# Patient Record
Sex: Male | Born: 1965 | Race: White | Hispanic: No | Marital: Married
Health system: Midwestern US, Community
[De-identification: ages and names within clinical notes are randomized; demographics above are authoritative.]

## PROBLEM LIST (undated history)

## (undated) DIAGNOSIS — I10 Essential (primary) hypertension: Secondary | ICD-10-CM

## (undated) DIAGNOSIS — K219 Gastro-esophageal reflux disease without esophagitis: Secondary | ICD-10-CM

## (undated) DIAGNOSIS — R202 Paresthesia of skin: Secondary | ICD-10-CM

## (undated) DIAGNOSIS — M549 Dorsalgia, unspecified: Secondary | ICD-10-CM

## (undated) DIAGNOSIS — Z87442 Personal history of urinary calculi: Secondary | ICD-10-CM

## (undated) DIAGNOSIS — R200 Anesthesia of skin: Secondary | ICD-10-CM

## (undated) HISTORY — DX: Dorsalgia, unspecified: M54.9

## (undated) HISTORY — DX: Essential (primary) hypertension: I10

## (undated) HISTORY — PX: COLONOSCOPY: SHX174

## (undated) HISTORY — PX: LITHOTRIPSY: SUR834

---

## 2001-08-26 ENCOUNTER — Encounter: Payer: Self-pay | Admitting: Emergency Medicine

## 2001-08-26 ENCOUNTER — Emergency Department (HOSPITAL_COMMUNITY): Admission: EM | Admit: 2001-08-26 | Discharge: 2001-08-26 | Payer: Self-pay | Admitting: Emergency Medicine

## 2004-03-23 HISTORY — PX: CYSTO: SHX6284

## 2004-08-02 ENCOUNTER — Encounter: Payer: Self-pay | Admitting: Emergency Medicine

## 2004-08-02 ENCOUNTER — Ambulatory Visit (HOSPITAL_COMMUNITY): Admission: RE | Admit: 2004-08-02 | Discharge: 2004-08-02 | Payer: Self-pay | Admitting: Urology

## 2004-08-07 ENCOUNTER — Ambulatory Visit (HOSPITAL_COMMUNITY): Admission: RE | Admit: 2004-08-07 | Discharge: 2004-08-07 | Payer: Self-pay | Admitting: Urology

## 2005-01-02 ENCOUNTER — Ambulatory Visit (HOSPITAL_COMMUNITY): Admission: AD | Admit: 2005-01-02 | Discharge: 2005-01-02 | Payer: Self-pay | Admitting: Urology

## 2005-01-02 ENCOUNTER — Emergency Department (HOSPITAL_COMMUNITY): Admission: EM | Admit: 2005-01-02 | Discharge: 2005-01-02 | Payer: Self-pay | Admitting: Emergency Medicine

## 2005-01-08 ENCOUNTER — Ambulatory Visit (HOSPITAL_COMMUNITY): Admission: RE | Admit: 2005-01-08 | Discharge: 2005-01-08 | Payer: Self-pay | Admitting: Urology

## 2005-01-20 ENCOUNTER — Emergency Department (HOSPITAL_COMMUNITY): Admission: EM | Admit: 2005-01-20 | Discharge: 2005-01-21 | Payer: Self-pay | Admitting: Emergency Medicine

## 2005-01-21 ENCOUNTER — Ambulatory Visit (HOSPITAL_COMMUNITY): Admission: RE | Admit: 2005-01-21 | Discharge: 2005-01-21 | Payer: Self-pay | Admitting: Urology

## 2005-01-21 ENCOUNTER — Ambulatory Visit (HOSPITAL_BASED_OUTPATIENT_CLINIC_OR_DEPARTMENT_OTHER): Admission: RE | Admit: 2005-01-21 | Discharge: 2005-01-21 | Payer: Self-pay | Admitting: Urology

## 2006-11-28 IMAGING — CT CT ABDOMEN W/O CM
1 series · 15 of 32 positions shown, 19 images · IV contrast (agent unspecified)
Comparison: 08/02/04.

CLINICAL DATA: Low back pain, testicular pain.  History of renal stones.   
ABDOMEN CT WITHOUT CONTRAST:
TECHNIQUE: Multidetector CT imaging of the abdomen was performed following the standard protocol without IV contrast.
TECHNIQUE: Multidetector CT imaging of the pelvis was performed following the standard protocol without IV contrast. 
No stone in the bladder or on either distal ureter. The prostate gland and seminal vesicles appear normal.  No mass or adenopathy.  No bowel pathology seen.

[Series 2: renal stone · axial · 0.70mm/px · z∈[-482,-147]mm · 15 of 75 slices shown, 19 images]
[im 5/75  soft-tissue]
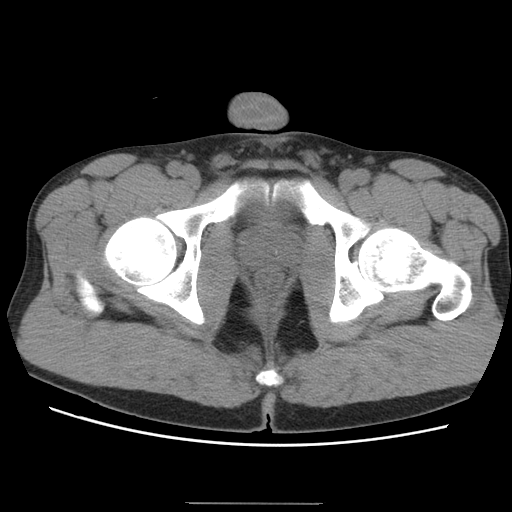
[im 5/75  bone]
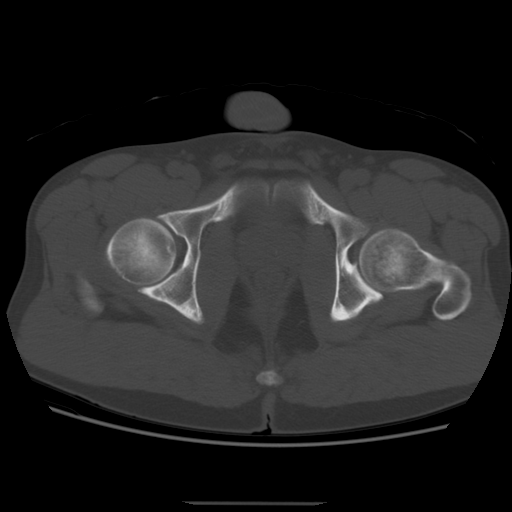
[im 10/75  soft-tissue]
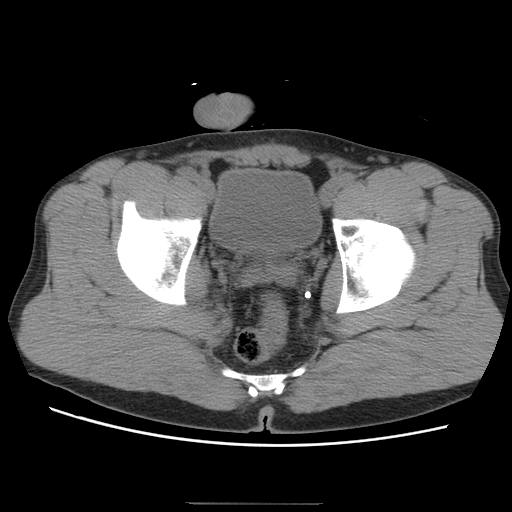
[im 15/75  soft-tissue]
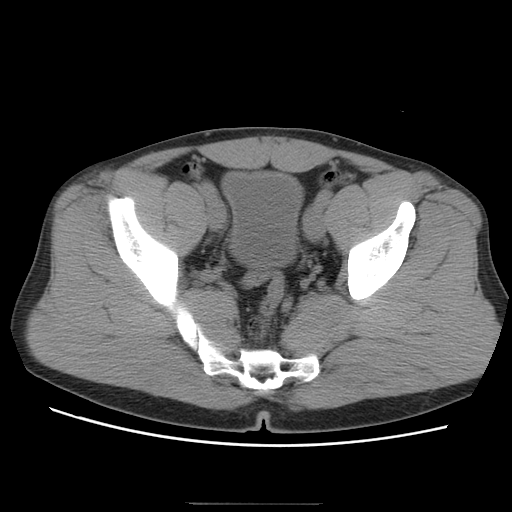
[im 22/75  soft-tissue]
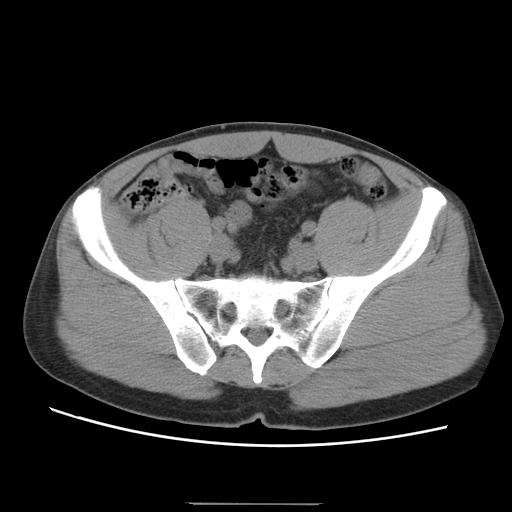
[im 27/75  soft-tissue]
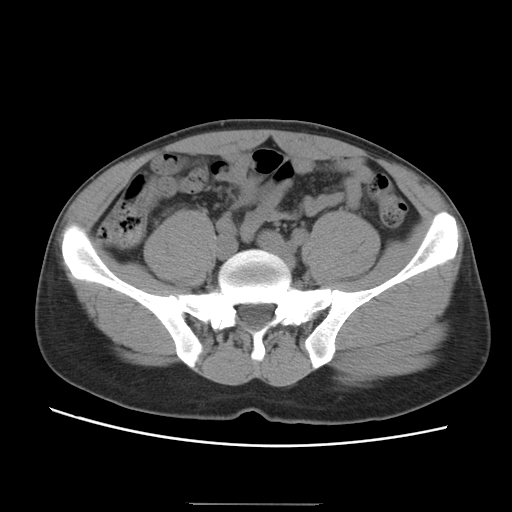
[im 32/75  soft-tissue]
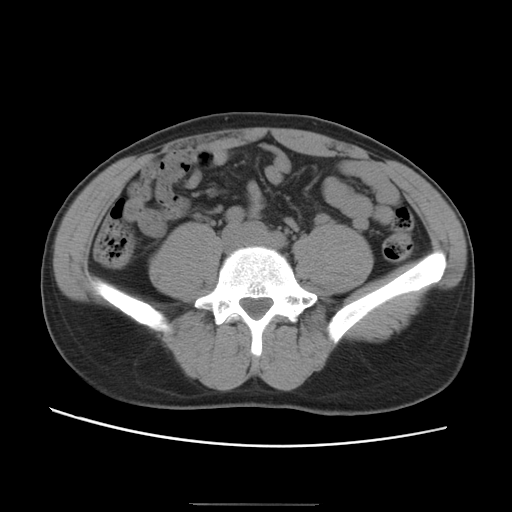
[im 39/75  soft-tissue]
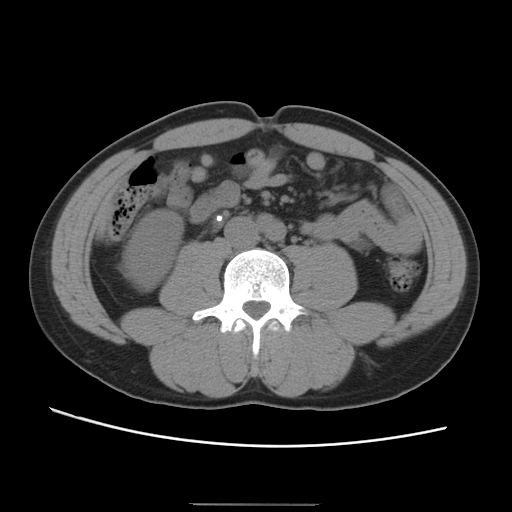
[im 43/75  soft-tissue]
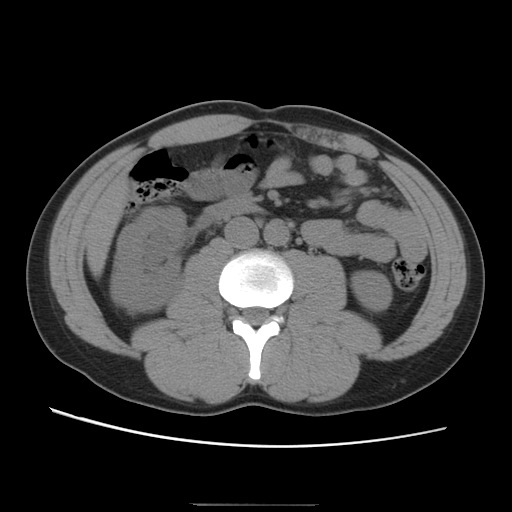
[im 48/75  soft-tissue]
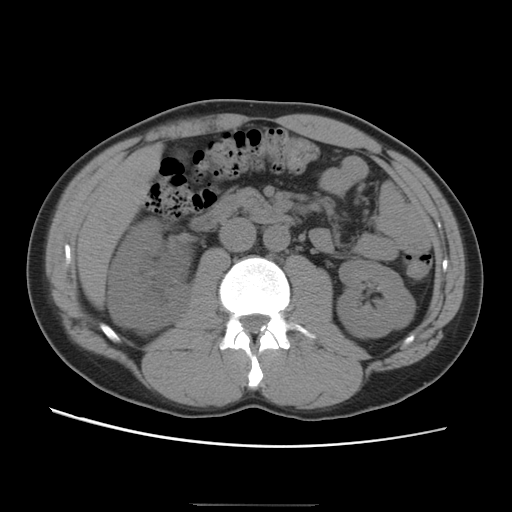
[im 48/75  bone]
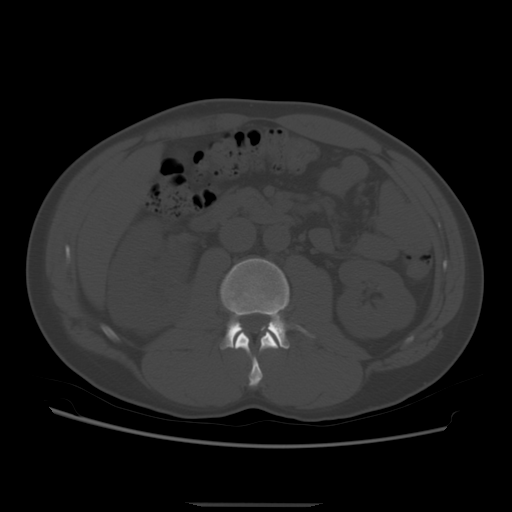
[im 53/75  soft-tissue]
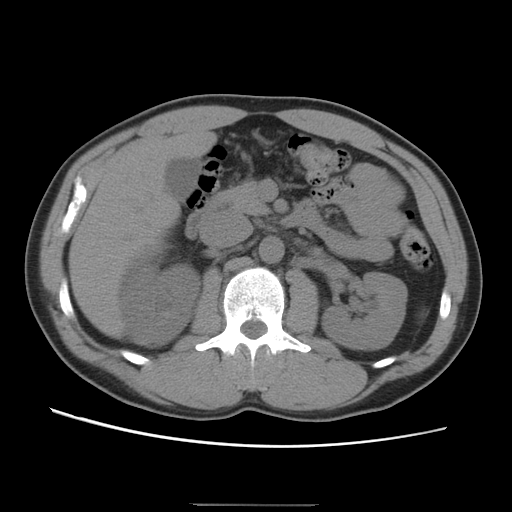
[im 60/75  soft-tissue]
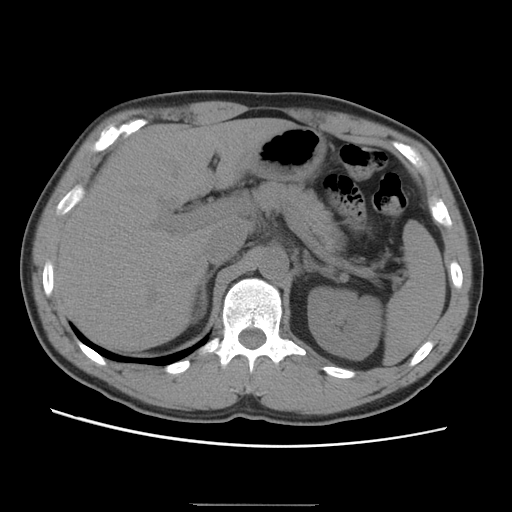
[im 65/75  soft-tissue]
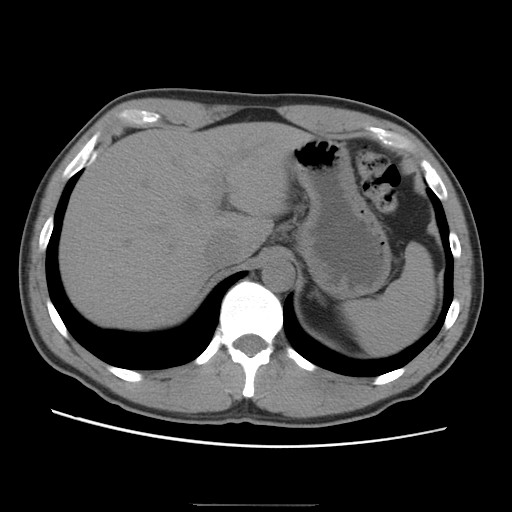
[im 65/75  lung]
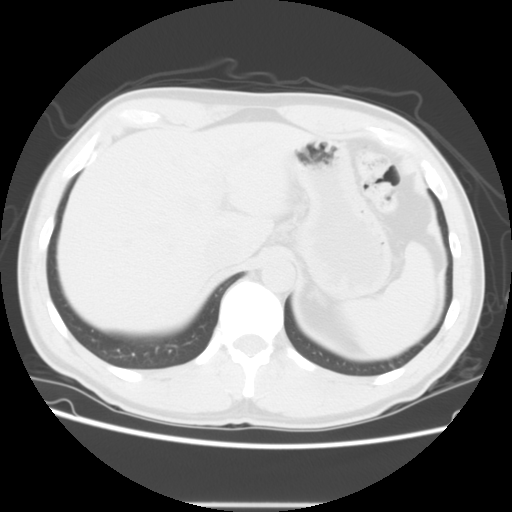
[im 67/75  lung]
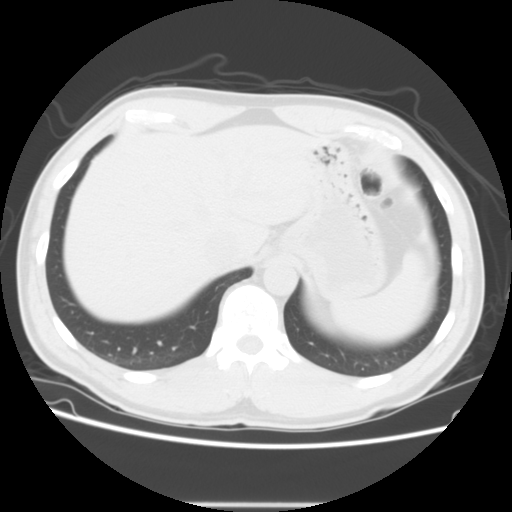
[im 70/75  soft-tissue]
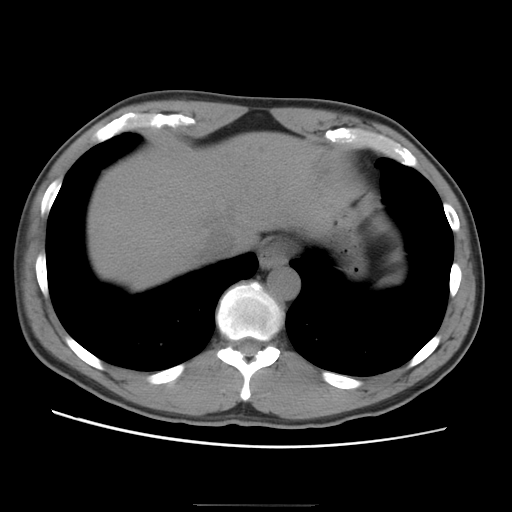
[im 70/75  lung]
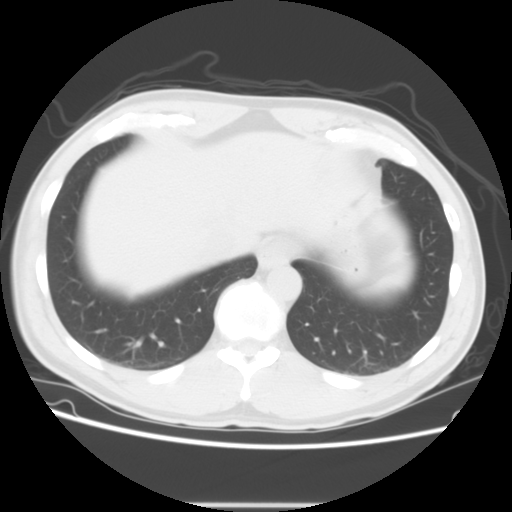
[im 72/75  lung]
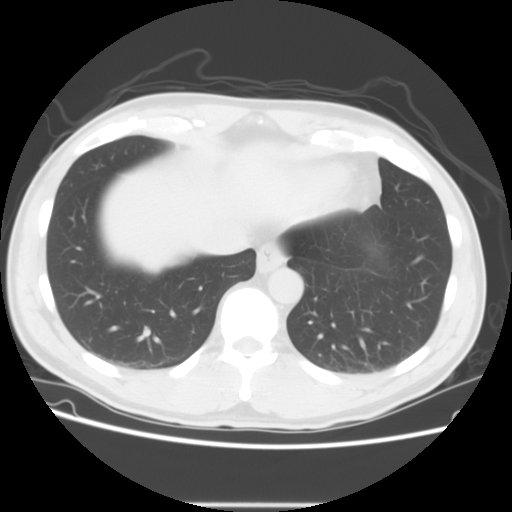

[15 of 32 positions shown; findings below may reference images not displayed]

FINDINGS: Lung bases are clear.  The liver, pancreas, gallbladder, spleen and adrenal glands are all normal.  The left kidney contains some tiny stones but there is no hydronephrosis.  
On the right, there is marked hydroureteronephrosis.  There are some tiny calcifications in the kidney.   There is a 5 mm stone in the proximal ureter at about the level of the mid to lower pole.  Below that, the ureter is not dilated.   The aorta and IVC are normal.  No free intraperitoneal fluid or air.
IMPRESSION: High-grade obstruction of the right kidney due to a 5 mm stone in the proximal ureter.  This is the same location that stone was located [REDACTED]. 
PELVIS CT WITHOUT CONTRAST:
IMPRESSION: Negative CT scan of the pelvis.

## 2008-03-23 LAB — HM COLONOSCOPY: HM Colonoscopy: NORMAL

## 2009-06-23 ENCOUNTER — Ambulatory Visit: Payer: Self-pay

## 2011-03-06 ENCOUNTER — Encounter (HOSPITAL_BASED_OUTPATIENT_CLINIC_OR_DEPARTMENT_OTHER): Payer: Self-pay

## 2011-03-13 ENCOUNTER — Ambulatory Visit (HOSPITAL_BASED_OUTPATIENT_CLINIC_OR_DEPARTMENT_OTHER): Payer: BC Managed Care – PPO | Attending: Allergy and Immunology

## 2011-10-26 ENCOUNTER — Ambulatory Visit: Payer: BC Managed Care – PPO | Admitting: Internal Medicine

## 2011-11-16 ENCOUNTER — Ambulatory Visit: Payer: BC Managed Care – PPO | Admitting: Internal Medicine

## 2011-11-16 DIAGNOSIS — Z0289 Encounter for other administrative examinations: Secondary | ICD-10-CM

## 2012-05-05 ENCOUNTER — Emergency Department: Payer: Self-pay | Admitting: Emergency Medicine

## 2013-04-27 ENCOUNTER — Other Ambulatory Visit: Payer: Self-pay | Admitting: Chiropractic Medicine

## 2013-04-27 DIAGNOSIS — S76319A Strain of muscle, fascia and tendon of the posterior muscle group at thigh level, unspecified thigh, initial encounter: Secondary | ICD-10-CM

## 2013-05-02 ENCOUNTER — Ambulatory Visit
Admission: RE | Admit: 2013-05-02 | Discharge: 2013-05-02 | Disposition: A | Discharge status: Home / Self Care | Payer: BC Managed Care – PPO | Source: Ambulatory Visit | Attending: Chiropractic Medicine | Admitting: Chiropractic Medicine

## 2013-05-02 DIAGNOSIS — S76319A Strain of muscle, fascia and tendon of the posterior muscle group at thigh level, unspecified thigh, initial encounter: Secondary | ICD-10-CM

## 2014-10-04 ENCOUNTER — Telehealth: Payer: Self-pay | Admitting: Family Medicine

## 2014-10-04 NOTE — Telephone Encounter (Signed)
This is not the dose in Allscript. We have 100mg  4 times daily. Please review and have someone refill. [facility]

## 2014-10-04 NOTE — Telephone Encounter (Signed)
Pt needs a refill on gabapentin 800 mg 3 times daily sent to [facility] phone [phone].  His dosage went up so he has taken up his prescription.  He has enough for tomorrow but will be out for the weekend.

## 2014-10-04 NOTE — Telephone Encounter (Signed)
This is not the dose in Allscript. We have 100mg  4 times daily. Please review and have someone refill. [facility]

## 2014-10-04 NOTE — Telephone Encounter (Signed)
Was sent on accident. I am very sorry. [facility]

## 2014-10-04 NOTE — Telephone Encounter (Signed)
He is not a [facility] patient so I cannot refill or address

## 2014-10-05 ENCOUNTER — Telehealth: Payer: Self-pay | Admitting: Family Medicine

## 2014-10-05 ENCOUNTER — Other Ambulatory Visit: Payer: Self-pay | Admitting: Family Medicine

## 2014-10-05 ENCOUNTER — Other Ambulatory Visit: Payer: Self-pay

## 2014-10-05 MED ORDER — GABAPENTIN 400 MG PO CAPS: 400.0000 mg | ORAL_CAPSULE | Freq: Three times a day (TID) | ORAL | Status: DC

## 2014-10-05 NOTE — Telephone Encounter (Signed)
Medication has been sent to Pharmacy.-jh

## 2014-10-05 NOTE — Telephone Encounter (Signed)
Mario Reef, Do I need to do anything on this patient or not?  His Gabapentin dose does not match what was requested.  Let me know when you find out.-jh

## 2014-10-05 NOTE — Telephone Encounter (Signed)
Pt called wanting to know if his  Gabapentin was  Refill pt call back # is  [phone]

## 2014-10-09 NOTE — Telephone Encounter (Signed)
No this was an error. [facility]

## 2014-10-09 NOTE — Telephone Encounter (Signed)
I have already sent his Gabapentin refill in to pharmacy.-jh

## 2014-10-24 ENCOUNTER — Ambulatory Visit (INDEPENDENT_AMBULATORY_CARE_PROVIDER_SITE_OTHER): Payer: 59 | Admitting: Family Medicine

## 2014-10-24 ENCOUNTER — Encounter: Payer: Self-pay | Admitting: Family Medicine

## 2014-10-24 VITALS — BP 126/84 | HR 76 | Temp 98.0°F | Resp 16 | Ht 69.0 in | Wt 193.0 lb

## 2014-10-24 DIAGNOSIS — M545 Low back pain, unspecified: Secondary | ICD-10-CM | POA: Insufficient documentation

## 2014-10-24 DIAGNOSIS — G8929 Other chronic pain: Secondary | ICD-10-CM | POA: Insufficient documentation

## 2014-10-24 DIAGNOSIS — E785 Hyperlipidemia, unspecified: Secondary | ICD-10-CM | POA: Insufficient documentation

## 2014-10-24 DIAGNOSIS — F419 Anxiety disorder, unspecified: Secondary | ICD-10-CM

## 2014-10-24 DIAGNOSIS — J028 Acute pharyngitis due to other specified organisms: Secondary | ICD-10-CM

## 2014-10-24 DIAGNOSIS — J0111 Acute recurrent frontal sinusitis: Secondary | ICD-10-CM | POA: Insufficient documentation

## 2014-10-24 DIAGNOSIS — K2970 Gastritis, unspecified, without bleeding: Secondary | ICD-10-CM | POA: Insufficient documentation

## 2014-10-24 DIAGNOSIS — J40 Bronchitis, not specified as acute or chronic: Secondary | ICD-10-CM | POA: Insufficient documentation

## 2014-10-24 DIAGNOSIS — B9789 Other viral agents as the cause of diseases classified elsewhere: Secondary | ICD-10-CM | POA: Insufficient documentation

## 2014-10-24 DIAGNOSIS — F32A Depression, unspecified: Secondary | ICD-10-CM | POA: Insufficient documentation

## 2014-10-24 DIAGNOSIS — G4700 Insomnia, unspecified: Secondary | ICD-10-CM | POA: Insufficient documentation

## 2014-10-24 DIAGNOSIS — F329 Major depressive disorder, single episode, unspecified: Secondary | ICD-10-CM | POA: Insufficient documentation

## 2014-10-24 DIAGNOSIS — I10 Essential (primary) hypertension: Secondary | ICD-10-CM | POA: Insufficient documentation

## 2014-10-24 DIAGNOSIS — R5383 Other fatigue: Secondary | ICD-10-CM | POA: Insufficient documentation

## 2014-10-24 DIAGNOSIS — R1013 Epigastric pain: Secondary | ICD-10-CM | POA: Insufficient documentation

## 2014-10-24 MED ORDER — FLUTICASONE PROPIONATE 50 MCG/ACT NA SUSP: 2.0000 | Freq: Every day | NASAL | Status: DC

## 2014-10-24 MED ORDER — AMOXICILLIN-POT CLAVULANATE 875-125 MG PO TABS: 1.0000 | ORAL_TABLET | Freq: Two times a day (BID) | ORAL | Status: DC

## 2014-10-24 MED ORDER — CHLORPHEN-PE-ACETAMINOPHEN 2-5-325 MG PO TABS: 1.0000 | ORAL_TABLET | ORAL | Status: DC

## 2014-10-24 NOTE — Assessment & Plan Note (Signed)
Supportive care. Likely no secondary bacterial infection. Paper prescription given for patient to fill with persistent symptoms when he is out of town. Alarm symptoms reviewed.  Pt cautioned to use decongestants sparingly due to HBP.

## 2014-10-24 NOTE — Progress Notes (Signed)
Subjective:    Patient ID: Mario Morgan, male    DOB: 1965-06-11, 49 y.o.   MRN: [id number]  HPI: Mario Morgan is a 49 y.o. male presenting on 10/24/2014 for Sinusitis   Sinusitis This is a recurrent problem. The current episode started in the past 7 days. The problem has been gradually worsening since onset. There has been no fever. His pain is at a severity of 2/10. Associated symptoms include headaches (slight), sinus pressure and sneezing. Pertinent negatives include no chills, congestion, coughing, ear pain, shortness of breath or sore throat. Past treatments include acetaminophen. The treatment provided mild relief.  Symptoms started on Sunday- sinus pain and pressure behind the eyes. Took mucinex without any relief. Has been using flonase for the last few days. Pt is concerned because he is going on out town on Friday and is worried he could have a sinus infection.       Past Medical History  Diagnosis Date  . Hypertension   . Depression   . Back pain   . Hyperlipidemia     No current outpatient prescriptions on file prior to visit.   No current facility-administered medications on file prior to visit.    Review of Systems  Constitutional: Negative for fever and chills.  HENT: Positive for rhinorrhea, sinus pressure and sneezing. Negative for congestion, ear pain, postnasal drip and sore throat.   Respiratory: Negative for cough, chest tightness, shortness of breath and wheezing.   Cardiovascular: Negative for chest pain, palpitations and leg swelling.  Skin: Negative.   Allergic/Immunologic: Positive for environmental allergies.  Neurological: Positive for headaches (slight).   Per HPI unless specifically indicated above     Objective:    BP 126/84 mmHg  Pulse 76  Temp(Src) 98 F (36.7 C) (Oral)  Resp 16  Ht 5\' 9"  (1.753 m)  Wt 193 lb (87.544 kg)  BMI 28.49 kg/m2  Wt Readings from Last 3 Encounters:  10/24/14 193 lb (87.544 kg)    Physical Exam   Constitutional: He appears well-developed and well-nourished. No distress.  HENT:  Head: Normocephalic.  Right Ear: Hearing and tympanic membrane normal. Tympanic membrane is not erythematous and not bulging.  Left Ear: Hearing and tympanic membrane normal. Tympanic membrane is not erythematous and not bulging.  Nose: Mucosal edema and rhinorrhea present. No sinus tenderness or nasal septal hematoma. Right sinus exhibits frontal sinus tenderness. Right sinus exhibits no maxillary sinus tenderness. Left sinus exhibits frontal sinus tenderness. Left sinus exhibits no maxillary sinus tenderness.  Mouth/Throat: Uvula is midline and mucous membranes are normal. No uvula swelling. No posterior oropharyngeal edema or posterior oropharyngeal erythema.  Neck: Neck supple. No Brudzinski's sign and no Kernig's sign noted.  Cardiovascular: Normal rate, regular rhythm and normal heart sounds.   Pulmonary/Chest: Breath sounds normal. No accessory muscle usage. No tachypnea. No respiratory distress.  Lymphadenopathy:    He has no cervical adenopathy.   Results for orders placed or performed in visit on 10/24/14  HM COLONOSCOPY  Result Value Ref Range   HM Colonoscopy normal       Assessment & Plan:   Problem List Items Addressed This Visit      Respiratory   Sinus infection - Primary    Supportive care. Likely no secondary bacterial infection. Paper prescription given for patient to fill with persistent symptoms when he is out of town. Alarm symptoms reviewed.  Pt cautioned to use decongestants sparingly due to HBP.       Relevant Medications  amoxicillin-clavulanate (AUGMENTIN) 875-125 MG per tablet   Chlorphen-Phenyleph-APAP (TYLENOL SINUS CONGESTION/PAIN) 2-5-325 MG TABS   fluticasone (FLONASE) 50 MCG/ACT nasal spray      Meds ordered this encounter  Medications  . cyclobenzaprine (FLEXERIL) 10 MG tablet    Sig: Take by mouth.  Marland Kitchen amoxicillin-clavulanate (AUGMENTIN) 875-125 MG per tablet     Sig: Take 1 tablet by mouth 2 (two) times daily.    Dispense:  14 tablet    Refill:  0    Order Specific Question:  Supervising Provider    Answer:  Arlis Porta [phone]  . Chlorphen-Phenyleph-APAP (TYLENOL SINUS CONGESTION/PAIN) 2-5-325 MG TABS    Sig: Take 1 tablet by mouth 4 (four) times a week.    Dispense:  80 each    Refill:  0    Order Specific Question:  Supervising Provider    Answer:  Arlis Porta [phone]  . fluticasone (FLONASE) 50 MCG/ACT nasal spray    Sig: Place 2 sprays into both nostrils daily.    Dispense:  16 g    Refill:  11    Order Specific Question:  Supervising Provider    Answer:  Arlis Porta [phone]      Follow up plan: Return if symptoms worsen or fail to improve.

## 2014-10-24 NOTE — Patient Instructions (Signed)
Your symptoms are consistent with a viral upper respiratory infection. At this time there is no need for antibiotics.  If your symptoms persist for > 10 days or get better and than worsen please let me know. You may have a secondary bacterial infection.  For your congestion try: Tylenol Sinus OTC. This may raise your blood pressure as it has a decongestant. Recommend using your neti pot 3 times daily to rinse your sinuses.   I think the root cause of your symptoms is allergies- I recommend taking Zyrtec or Claritin daily and using your Flonase.

## 2014-10-31 ENCOUNTER — Ambulatory Visit (INDEPENDENT_AMBULATORY_CARE_PROVIDER_SITE_OTHER): Payer: 59 | Admitting: Family Medicine

## 2014-10-31 ENCOUNTER — Encounter: Payer: Self-pay | Admitting: Family Medicine

## 2014-10-31 VITALS — BP 130/90 | HR 74 | Temp 97.5°F | Resp 16 | Ht 69.0 in | Wt 194.0 lb

## 2014-10-31 DIAGNOSIS — J0111 Acute recurrent frontal sinusitis: Secondary | ICD-10-CM | POA: Diagnosis not present

## 2014-10-31 DIAGNOSIS — I10 Essential (primary) hypertension: Secondary | ICD-10-CM

## 2014-10-31 LAB — CBC WITH DIFFERENTIAL/PLATELET
BASOS: 1 %
Basophils Absolute: 0 10*3/uL (ref 0.0–0.2)
EOS (ABSOLUTE): 0.2 10*3/uL (ref 0.0–0.4)
Eos: 3 %
HEMOGLOBIN: 15.7 g/dL (ref 12.6–17.7)
Hematocrit: 44.6 % (ref 37.5–51.0)
Immature Grans (Abs): 0 10*3/uL (ref 0.0–0.1)
Immature Granulocytes: 0 %
LYMPHS: 25 %
Lymphocytes Absolute: 1.6 10*3/uL (ref 0.7–3.1)
MCH: 29.5 pg (ref 26.6–33.0)
MCHC: 35.2 g/dL (ref 31.5–35.7)
MCV: 84 fL (ref 79–97)
MONOS ABS: 0.5 10*3/uL (ref 0.1–0.9)
Monocytes: 8 %
Neutrophils Absolute: 3.9 10*3/uL (ref 1.4–7.0)
Neutrophils: 63 %
Platelets: 247 10*3/uL (ref 150–379)
RBC: 5.32 x10E6/uL (ref 4.14–5.80)
RDW: 14.1 % (ref 12.3–15.4)
WBC: 6.2 10*3/uL (ref 3.4–10.8)

## 2014-10-31 MED ORDER — LEVOFLOXACIN 500 MG PO TABS: 500.0000 mg | ORAL_TABLET | Freq: Every day | ORAL | Status: DC

## 2014-10-31 MED ORDER — LEVOFLOXACIN 750 MG PO TABS: 750.0000 mg | ORAL_TABLET | Freq: Every day | ORAL | Status: DC

## 2014-10-31 NOTE — Assessment & Plan Note (Addendum)
Failed augmentin. Treat with second line levofloxacin once daily for 10 days. Supportive care at home. If symptoms not improving, consider looking for other causes or ENT evaluation.  CBC due to patient malaise.

## 2014-10-31 NOTE — Assessment & Plan Note (Addendum)
Continue lisinopril 10mg . Pt cautioned against use of sudafed or phenylephrine due to possibility of increasing blood pressure.  Pt due for CMP.

## 2014-10-31 NOTE — Patient Instructions (Addendum)

## 2014-10-31 NOTE — Progress Notes (Signed)
Subjective:    Patient ID: Mario Morgan, male    DOB: 16-Feb-1966, 49 y.o.   MRN: [id number]  HPI: Mario Morgan is a 49 y.o. male presenting on 10/31/2014 for Sinusitis   HPI  Pt presents for recurrent sinusitis. He started Augmentin 1 week ago. Using flonase daily. Using neti pot. Still reporting sinus pressure behind his eyes. Sinus HA daily. No fevers at home. Taking claritin daily. Took mucinex this morning with mild relief.   Past Medical History  Diagnosis Date  . Hypertension   . Depression   . Back pain   . Hyperlipidemia     Current Outpatient Prescriptions on File Prior to Visit  Medication Sig  . fluticasone (FLONASE) 50 MCG/ACT nasal spray Place 2 sprays into both nostrils daily.  Mario Morgan lisinopril (PRINIVIL,ZESTRIL) 10 MG tablet Take 10 mg by mouth daily.  Mario Morgan omeprazole (PRILOSEC) 20 MG capsule Take 20 mg by mouth daily.   No current facility-administered medications on file prior to visit.    Review of Systems  Constitutional: Positive for fatigue. Negative for fever and chills.  HENT: Positive for sinus pressure.   Eyes: Negative for redness, itching and visual disturbance.  Respiratory: Negative for chest tightness and shortness of breath.   Cardiovascular: Negative for chest pain, palpitations and leg swelling.  Gastrointestinal: Positive for nausea. Negative for vomiting, abdominal pain and blood in stool.  Genitourinary: Negative for flank pain and difficulty urinating.  Musculoskeletal: Negative for back pain.  Skin: Negative for color change and rash.  Neurological: Positive for headaches. Negative for dizziness, speech difficulty, weakness and numbness.   Per HPI unless specifically indicated above     Objective:    BP 130/90 mmHg  Pulse 74  Temp(Src) 97.5 F (36.4 C) (Oral)  Resp 16  Ht 5\' 9"  (1.753 m)  Wt 194 lb (87.998 kg)  BMI 28.64 kg/m2  Wt Readings from Last 3 Encounters:  10/31/14 194 lb (87.998 kg)  10/24/14 193 lb (87.544 kg)     Physical Exam  Constitutional: He appears well-developed and well-nourished. No distress.  HENT:  Head: Normocephalic and atraumatic.  Right Ear: Tympanic membrane is not erythematous and not bulging.  Left Ear: Tympanic membrane is not erythematous and not bulging.  Nose: Mucosal edema present. No rhinorrhea or sinus tenderness. Right sinus exhibits frontal sinus tenderness. Right sinus exhibits no maxillary sinus tenderness. Left sinus exhibits no maxillary sinus tenderness and no frontal sinus tenderness.  Mouth/Throat: Uvula is midline and mucous membranes are normal. No posterior oropharyngeal edema or posterior oropharyngeal erythema.  Neck: Neck supple. No Brudzinski's sign and no Kernig's sign noted.  Cardiovascular: Normal rate, regular rhythm and normal heart sounds.   Pulmonary/Chest: Breath sounds normal. No accessory muscle usage. No tachypnea. No respiratory distress.  Lymphadenopathy:    He has no cervical adenopathy.   Results for orders placed or performed in visit on 10/24/14  HM COLONOSCOPY  Result Value Ref Range   HM Colonoscopy normal       Assessment & Plan:   Problem List Items Addressed This Visit      Cardiovascular and Mediastinum   Essential (primary) hypertension    Continue lisinopril 10mg . Pt cautioned against use of sudafed or phenylephrine due to possibility of increasing blood pressure.  Pt due for CMP.      Relevant Orders   Comprehensive metabolic panel     Respiratory   Acute recurrent frontal sinusitis - Primary    Failed augmentin. Treat with second line  levofloxacin once daily for 10 days. Supportive care at home. If symptoms not improving, consider looking for other causes or ENT evaluation.  CBC due to patient malaise.       Relevant Medications   levofloxacin (LEVAQUIN) 500 MG tablet   Other Relevant Orders   CBC with Differential/Platelet      Meds ordered this encounter  Medications  . levofloxacin (LEVAQUIN) 500 MG tablet     Sig: Take 1 tablet (500 mg total) by mouth daily.    Dispense:  10 tablet    Refill:  0    Order Specific Question:  Supervising Provider    Answer:  Arlis Porta [[id number]]      Follow up plan: Return if symptoms worsen or fail to improve.

## 2014-11-01 ENCOUNTER — Ambulatory Visit: Payer: Self-pay | Admitting: Family Medicine

## 2014-11-01 LAB — COMPREHENSIVE METABOLIC PANEL
A/G RATIO: 2.4 (ref 1.1–2.5)
ALBUMIN: 5.1 g/dL (ref 3.5–5.5)
ALT: 36 IU/L (ref 0–44)
AST: 32 IU/L (ref 0–40)
Alkaline Phosphatase: 83 IU/L (ref 39–117)
BUN / CREAT RATIO: 17 (ref 9–20)
BUN: 16 mg/dL (ref 6–24)
Bilirubin Total: 0.4 mg/dL (ref 0.0–1.2)
CO2: 24 mmol/L (ref 18–29)
Calcium: 9.6 mg/dL (ref 8.7–10.2)
Chloride: 96 mmol/L — ABNORMAL LOW (ref 97–108)
Creatinine, Ser: 0.95 mg/dL (ref 0.76–1.27)
GFR, EST AFRICAN AMERICAN: 108 mL/min/{1.73_m2} (ref >59)
GFR, EST NON AFRICAN AMERICAN: 94 mL/min/{1.73_m2} (ref >59)
Globulin, Total: 2.1 g/dL (ref 1.5–4.5)
Glucose: 83 mg/dL (ref 65–99)
POTASSIUM: 5.2 mmol/L (ref 3.5–5.2)
Sodium: 138 mmol/L (ref 134–144)
TOTAL PROTEIN: 7.2 g/dL (ref 6.0–8.5)

## 2014-12-03 ENCOUNTER — Other Ambulatory Visit: Payer: Self-pay | Admitting: Physical Medicine and Rehabilitation

## 2014-12-03 DIAGNOSIS — M5126 Other intervertebral disc displacement, lumbar region: Secondary | ICD-10-CM

## 2014-12-12 ENCOUNTER — Other Ambulatory Visit: Payer: Self-pay | Admitting: Physical Medicine and Rehabilitation

## 2014-12-12 ENCOUNTER — Ambulatory Visit
Admission: RE | Admit: 2014-12-12 | Discharge: 2014-12-12 | Disposition: A | Discharge status: Home / Self Care | Payer: 59 | Source: Ambulatory Visit | Attending: Physical Medicine and Rehabilitation | Admitting: Physical Medicine and Rehabilitation

## 2014-12-12 DIAGNOSIS — M5126 Other intervertebral disc displacement, lumbar region: Secondary | ICD-10-CM

## 2014-12-12 MED ORDER — FENTANYL CITRATE (PF) 100 MCG/2ML IJ SOLN
25.0000 ug | INTRAMUSCULAR | Status: DC | PRN
  Administered 2014-12-12: 100 ug via INTRAVENOUS

## 2014-12-12 MED ORDER — MIDAZOLAM HCL 2 MG/2ML IJ SOLN
1.0000 mg | INTRAMUSCULAR | Status: DC | PRN
  Administered 2014-12-12: 1 mg via INTRAVENOUS

## 2014-12-12 MED ORDER — CEFAZOLIN SODIUM-DEXTROSE 2-3 GM-% IV SOLR
2.0000 g | Freq: Once | INTRAVENOUS | Status: AC
  Administered 2014-12-12: 2 g via INTRAVENOUS

## 2014-12-12 MED ORDER — SODIUM CHLORIDE 0.9 % IV SOLN
Freq: Once | INTRAVENOUS | Status: AC
  Administered 2014-12-12: 08:00:00 via INTRAVENOUS

## 2014-12-12 MED ORDER — IOHEXOL 180 MG/ML  SOLN: 4.0000 mL | Freq: Once | INTRAMUSCULAR | Status: DC | PRN

## 2014-12-12 MED ORDER — KETOROLAC TROMETHAMINE 30 MG/ML IJ SOLN
30.0000 mg | Freq: Once | INTRAMUSCULAR | Status: AC
  Administered 2014-12-12: 30 mg via INTRAVENOUS

## 2014-12-12 NOTE — Discharge Instructions (Signed)
Discogram Post Procedure Discharge Instructions ° °1. May resume a regular diet and any medications that you routinely take (including pain medications). °2. No driving day of procedure. °3. Upon discharge go home and rest for at least 4 hours.  May use an ice pack as needed to injection sites on back.  Ice to back 30 minutes on and 30 minutes off, all day. °4. May remove bandades later, today. °5. It is not unusual to be sore for several days after this procedure. ° ° ° °Please contact our office at [phone] for the following symptoms: ° °· Fever greater than 100 degrees °· Increased swelling, pain, or redness at injection site. ° ° °Thank you for visiting [facility]. ° ° °

## 2014-12-13 ENCOUNTER — Telehealth: Payer: Self-pay

## 2014-12-13 ENCOUNTER — Other Ambulatory Visit: Payer: Self-pay | Admitting: Family Medicine

## 2014-12-13 DIAGNOSIS — M545 Low back pain: Principal | ICD-10-CM

## 2014-12-13 DIAGNOSIS — G8929 Other chronic pain: Secondary | ICD-10-CM

## 2014-12-13 NOTE — Telephone Encounter (Signed)
Patient would like second opinion at [facility] with Dr. Margarette Canada. He reports having had Disc procedure yesterday and refused appt with [place]. I explained last two visits were for illness not back pain and we need this documentation of OV for [facility] to cover. Patient would like to know if there is a way we can try to send it in without another visit. [phone].

## 2014-12-13 NOTE — Telephone Encounter (Signed)
You can try to refer to Raliegh Ip with notes from [place], May 2016 and 1-2 notes before.  Dx. Is sciatica  Not respnsive to injections.  He will probably need info from whoever is giving him the back injections also.  He would have to get that information.-jh

## 2014-12-19 ENCOUNTER — Other Ambulatory Visit: Payer: Self-pay | Admitting: Family Medicine

## 2014-12-19 ENCOUNTER — Other Ambulatory Visit (HOSPITAL_COMMUNITY): Payer: Self-pay | Admitting: Neurosurgery

## 2015-01-04 ENCOUNTER — Encounter (HOSPITAL_COMMUNITY)
Admission: RE | Admit: 2015-01-04 | Discharge: 2015-01-04 | Disposition: A | Discharge status: Home / Self Care | Payer: 59 | Source: Ambulatory Visit | Attending: Neurosurgery | Admitting: Neurosurgery

## 2015-01-04 ENCOUNTER — Encounter (HOSPITAL_COMMUNITY): Payer: Self-pay

## 2015-01-04 DIAGNOSIS — K219 Gastro-esophageal reflux disease without esophagitis: Secondary | ICD-10-CM | POA: Diagnosis not present

## 2015-01-04 DIAGNOSIS — Z01818 Encounter for other preprocedural examination: Secondary | ICD-10-CM | POA: Diagnosis not present

## 2015-01-04 DIAGNOSIS — Z87891 Personal history of nicotine dependence: Secondary | ICD-10-CM | POA: Insufficient documentation

## 2015-01-04 DIAGNOSIS — I10 Essential (primary) hypertension: Secondary | ICD-10-CM | POA: Insufficient documentation

## 2015-01-04 DIAGNOSIS — Z01812 Encounter for preprocedural laboratory examination: Secondary | ICD-10-CM | POA: Diagnosis not present

## 2015-01-04 DIAGNOSIS — Z79899 Other long term (current) drug therapy: Secondary | ICD-10-CM | POA: Insufficient documentation

## 2015-01-04 HISTORY — DX: Gastro-esophageal reflux disease without esophagitis: K21.9

## 2015-01-04 HISTORY — DX: Anesthesia of skin: R20.0

## 2015-01-04 HISTORY — DX: Personal history of urinary calculi: Z87.442

## 2015-01-04 HISTORY — DX: Anesthesia of skin: R20.2

## 2015-01-04 LAB — COMPREHENSIVE METABOLIC PANEL
ALBUMIN: 4.9 g/dL (ref 3.5–5.0)
ALK PHOS: 75 U/L (ref 38–126)
ALT: 30 U/L (ref 17–63)
AST: 30 U/L (ref 15–41)
Anion gap: 9 (ref 5–15)
BUN: 32 mg/dL — ABNORMAL HIGH (ref 6–20)
CALCIUM: 10.2 mg/dL (ref 8.9–10.3)
CO2: 24 mmol/L (ref 22–32)
CREATININE: 1.32 mg/dL — AB (ref 0.61–1.24)
Chloride: 104 mmol/L (ref 101–111)
GFR calc non Af Amer: >60 mL/min (ref >60)
GLUCOSE: 88 mg/dL (ref 65–99)
Potassium: 5.6 mmol/L — ABNORMAL HIGH (ref 3.5–5.1)
SODIUM: 137 mmol/L (ref 135–145)
Total Bilirubin: 0.9 mg/dL (ref 0.3–1.2)
Total Protein: 7.7 g/dL (ref 6.5–8.1)

## 2015-01-04 LAB — ABO/RH: ABO/RH(D): A POS

## 2015-01-04 LAB — CBC WITH DIFFERENTIAL/PLATELET
BASOS PCT: 1 %
Basophils Absolute: 0 10*3/uL (ref 0.0–0.1)
EOS ABS: 0.2 10*3/uL (ref 0.0–0.7)
EOS PCT: 4 %
HCT: 45.9 % (ref 39.0–52.0)
Hemoglobin: 15.3 g/dL (ref 13.0–17.0)
Lymphocytes Relative: 32 %
Lymphs Abs: 1.5 10*3/uL (ref 0.7–4.0)
MCH: 28.8 pg (ref 26.0–34.0)
MCHC: 33.3 g/dL (ref 30.0–36.0)
MCV: 86.3 fL (ref 78.0–100.0)
MONO ABS: 0.6 10*3/uL (ref 0.1–1.0)
MONOS PCT: 12 %
Neutro Abs: 2.5 10*3/uL (ref 1.7–7.7)
Neutrophils Relative %: 51 %
Platelets: 247 10*3/uL (ref 150–400)
RBC: 5.32 MIL/uL (ref 4.22–5.81)
RDW: 13 % (ref 11.5–15.5)
WBC: 4.8 10*3/uL (ref 4.0–10.5)

## 2015-01-04 LAB — TYPE AND SCREEN
ABO/RH(D): A POS
ANTIBODY SCREEN: NEGATIVE

## 2015-01-04 LAB — SURGICAL PCR SCREEN
MRSA, PCR: NEGATIVE
Staphylococcus aureus: NEGATIVE

## 2015-01-04 NOTE — Progress Notes (Signed)
Late entry:   Consent can not be filled out due to unapproved abbreviation (XLIF).  Notified Lorriane Shire at Dr. Marchelle Folks office on 01/03/15 at 12:30, who stated that it would corrected.

## 2015-01-04 NOTE — Progress Notes (Signed)
PCP - Dr. Larene Beach Cardiologist - denies  EKG- 01/04/15 CXR - denies  Echo/stress test/cardiac cath - denies  Patient denies chest pain and shortness of breath at PAT appointment.

## 2015-01-04 NOTE — Pre-Procedure Instructions (Signed)
    COREYON NICOTRA  01/04/2015      [facility] - [place], [place] [address] Phone: [phone] Fax: [phone]    Your procedure is scheduled on Monday, October 24th, 2016.  Report to [facility] Admitting at 6:00 A.M.  Call this number if you have problems the morning of surgery:  [phone]   Remember:  Do not eat food or drink liquids after midnight.  Take these medicines the morning of surgery with A SIP OF WATER: Acetaminophen (Tylenol) if needed, Baclofen (Lioresal) if needed, Cyclobenzaprine (Flexeril) if needed, Fluticasone (Flonase), Hydrocodone-acetaminophen (Norco/Vicodin) if needed, Omeprazole (Prilosec).  7 days prior to surgery, stop taking: Aspirin, NSAIDs, Ibuprofen, Advil, Motrin, Naproxen, Aleve, BC's, Goody's, Fish oil, all herbal medications, and all vitamins.    Do not wear jewelry.  Do not wear lotions, powders, or colognes.  You may NOT wear deodorant.  Men may shave face and neck.  Do not bring valuables to the hospital.  [facility] is not responsible for any belongings or valuables.  Contacts, dentures or bridgework may not be worn into surgery.  Leave your suitcase in the car.  After surgery it may be brought to your room.  For patients admitted to the hospital, discharge time will be determined by your treatment team.  Patients discharged the day of surgery will not be allowed to drive home.   Special instructions: See attached.  Please read over the following fact sheets that you were given. Pain Booklet, Coughing and Deep Breathing, Blood Transfusion Information, MRSA Information and Surgical Site Infection Prevention

## 2015-01-07 NOTE — Progress Notes (Signed)
Anesthesia Chart Review:  Pt is 49 year old male scheduled for L2-3 XLIF with percutaneous pedicle screws on 01/14/2015 with Dr. Annette Stable.   PMH includes: HTN, GERD. Former smoker. BMI 28.5.   Medications include: lisinopril, prilosec.   Preoperative labs reviewed.  K 5.6. Will recheck DOS.   EKG 01/04/2015: NSR.   Notified Vanessa in Dr. Marchelle Folks office of elevated K.   If labs acceptable DOS, I anticipate pt can proceed as scheduled.   Willeen Cass, FNP-BC [facility]/Anesthesiology Phone: [phone] 01/07/2015 2:47 PM

## 2015-01-08 ENCOUNTER — Other Ambulatory Visit (HOSPITAL_COMMUNITY): Payer: 59

## 2015-01-13 MED ORDER — CEFAZOLIN SODIUM-DEXTROSE 2-3 GM-% IV SOLR
2.0000 g | INTRAVENOUS | Status: AC
  Administered 2015-01-14: 2 g via INTRAVENOUS
  Filled 2015-01-13: qty 50

## 2015-01-13 MED ORDER — DEXAMETHASONE SODIUM PHOSPHATE 10 MG/ML IJ SOLN
10.0000 mg | INTRAMUSCULAR | Status: AC
  Administered 2015-01-14: 10 mg via INTRAVENOUS
  Filled 2015-01-13: qty 1

## 2015-01-14 ENCOUNTER — Encounter (HOSPITAL_COMMUNITY)
Admission: RE | Disposition: A | Discharge status: Home / Self Care | Payer: Self-pay | Source: Ambulatory Visit | Attending: Neurosurgery

## 2015-01-14 ENCOUNTER — Encounter (HOSPITAL_COMMUNITY): Payer: Self-pay | Admitting: Surgery

## 2015-01-14 ENCOUNTER — Inpatient Hospital Stay (HOSPITAL_COMMUNITY): Payer: 59 | Admitting: Anesthesiology

## 2015-01-14 ENCOUNTER — Inpatient Hospital Stay (HOSPITAL_COMMUNITY): Payer: 59 | Admitting: Emergency Medicine

## 2015-01-14 ENCOUNTER — Inpatient Hospital Stay (HOSPITAL_COMMUNITY)
Admission: RE | Admit: 2015-01-14 | Discharge: 2015-01-15 | DRG: 460 | Disposition: A | Discharge status: Home / Self Care | Payer: 59 | Source: Ambulatory Visit | Attending: Neurosurgery | Admitting: Neurosurgery

## 2015-01-14 ENCOUNTER — Inpatient Hospital Stay (HOSPITAL_COMMUNITY): Payer: 59

## 2015-01-14 DIAGNOSIS — Z87891 Personal history of nicotine dependence: Secondary | ICD-10-CM | POA: Diagnosis not present

## 2015-01-14 DIAGNOSIS — M549 Dorsalgia, unspecified: Secondary | ICD-10-CM | POA: Diagnosis present

## 2015-01-14 DIAGNOSIS — M5136 Other intervertebral disc degeneration, lumbar region: Secondary | ICD-10-CM | POA: Diagnosis present

## 2015-01-14 DIAGNOSIS — M5126 Other intervertebral disc displacement, lumbar region: Secondary | ICD-10-CM | POA: Diagnosis present

## 2015-01-14 DIAGNOSIS — I10 Essential (primary) hypertension: Secondary | ICD-10-CM | POA: Diagnosis present

## 2015-01-14 DIAGNOSIS — Z79899 Other long term (current) drug therapy: Secondary | ICD-10-CM

## 2015-01-14 DIAGNOSIS — K219 Gastro-esophageal reflux disease without esophagitis: Secondary | ICD-10-CM | POA: Diagnosis present

## 2015-01-14 DIAGNOSIS — Z419 Encounter for procedure for purposes other than remedying health state, unspecified: Secondary | ICD-10-CM

## 2015-01-14 HISTORY — PX: ANTERIOR LATERAL LUMBAR FUSION WITH PERCUTANEOUS SCREW 1 LEVEL: SHX5553

## 2015-01-14 LAB — POCT I-STAT 4, (NA,K, GLUC, HGB,HCT)
GLUCOSE: 93 mg/dL (ref 65–99)
HEMATOCRIT: 45 % (ref 39.0–52.0)
HEMOGLOBIN: 15.3 g/dL (ref 13.0–17.0)
POTASSIUM: 4.7 mmol/L (ref 3.5–5.1)
Sodium: 140 mmol/L (ref 135–145)

## 2015-01-14 SURGERY — ANTERIOR LATERAL LUMBAR FUSION WITH PERCUTANEOUS SCREW 1 LEVEL
Anesthesia: General | Site: Back | Laterality: Left

## 2015-01-14 MED ORDER — LISINOPRIL 20 MG PO TABS
10.0000 mg | ORAL_TABLET | Freq: Every day | ORAL | Status: DC
  Administered 2015-01-14: 10 mg via ORAL
  Filled 2015-01-14: qty 1

## 2015-01-14 MED ORDER — HYDROMORPHONE HCL 1 MG/ML IJ SOLN
0.2500 mg | INTRAMUSCULAR | Status: DC | PRN
  Administered 2015-01-14 (×4): 0.5 mg via INTRAVENOUS

## 2015-01-14 MED ORDER — HYDROCODONE-ACETAMINOPHEN 5-325 MG PO TABS
ORAL_TABLET | ORAL | Status: AC
  Filled 2015-01-14: qty 2

## 2015-01-14 MED ORDER — HYDROMORPHONE HCL 1 MG/ML IJ SOLN
INTRAMUSCULAR | Status: AC
  Filled 2015-01-14: qty 1

## 2015-01-14 MED ORDER — THROMBIN 5000 UNITS EX SOLR
CUTANEOUS | Status: DC | PRN
  Administered 2015-01-14 (×2): 5000 [IU] via TOPICAL

## 2015-01-14 MED ORDER — ACETAMINOPHEN 650 MG RE SUPP: 650.0000 mg | RECTAL | Status: DC | PRN

## 2015-01-14 MED ORDER — LIDOCAINE HCL (CARDIAC) 20 MG/ML IV SOLN
INTRAVENOUS | Status: DC | PRN
  Administered 2015-01-14: 100 mg via INTRAVENOUS

## 2015-01-14 MED ORDER — SODIUM CHLORIDE 0.9 % IR SOLN
Status: DC | PRN
  Administered 2015-01-14: 500 mL

## 2015-01-14 MED ORDER — PROPOFOL 500 MG/50ML IV EMUL
INTRAVENOUS | Status: DC | PRN
  Administered 2015-01-14: 100 ug/kg/min via INTRAVENOUS
  Administered 2015-01-14: 85 ug/kg/min via INTRAVENOUS

## 2015-01-14 MED ORDER — PROPOFOL 10 MG/ML IV BOLUS
INTRAVENOUS | Status: DC | PRN
  Administered 2015-01-14: 130 mg via INTRAVENOUS

## 2015-01-14 MED ORDER — FENTANYL CITRATE (PF) 100 MCG/2ML IJ SOLN
INTRAMUSCULAR | Status: DC | PRN
  Administered 2015-01-14 (×2): 50 ug via INTRAVENOUS
  Administered 2015-01-14: 200 ug via INTRAVENOUS
  Administered 2015-01-14 (×4): 50 ug via INTRAVENOUS

## 2015-01-14 MED ORDER — DEXAMETHASONE SODIUM PHOSPHATE 10 MG/ML IJ SOLN
INTRAMUSCULAR | Status: AC
  Filled 2015-01-14: qty 1

## 2015-01-14 MED ORDER — OXYCODONE-ACETAMINOPHEN 5-325 MG PO TABS: 1.0000 | ORAL_TABLET | ORAL | Status: DC | PRN

## 2015-01-14 MED ORDER — HYDROMORPHONE HCL 1 MG/ML IJ SOLN
0.5000 mg | INTRAMUSCULAR | Status: DC | PRN
  Administered 2015-01-14 (×2): 1 mg via INTRAVENOUS
  Filled 2015-01-14 (×2): qty 1

## 2015-01-14 MED ORDER — ONDANSETRON HCL 4 MG/2ML IJ SOLN: 4.0000 mg | INTRAMUSCULAR | Status: DC | PRN

## 2015-01-14 MED ORDER — PANTOPRAZOLE SODIUM 40 MG PO TBEC
40.0000 mg | DELAYED_RELEASE_TABLET | Freq: Every day | ORAL | Status: DC
  Filled 2015-01-14: qty 1

## 2015-01-14 MED ORDER — BUPIVACAINE HCL (PF) 0.25 % IJ SOLN
INTRAMUSCULAR | Status: DC | PRN
  Administered 2015-01-14: 30 mL

## 2015-01-14 MED ORDER — MENTHOL 3 MG MT LOZG: 1.0000 | LOZENGE | OROMUCOSAL | Status: DC | PRN

## 2015-01-14 MED ORDER — DIAZEPAM 5 MG PO TABS
ORAL_TABLET | ORAL | Status: AC
  Filled 2015-01-14: qty 2

## 2015-01-14 MED ORDER — MEPERIDINE HCL 25 MG/ML IJ SOLN: 6.2500 mg | INTRAMUSCULAR | Status: DC | PRN

## 2015-01-14 MED ORDER — PROPOFOL 500 MG/50ML IV EMUL: INTRAVENOUS | Status: DC | PRN

## 2015-01-14 MED ORDER — MIDAZOLAM HCL 5 MG/5ML IJ SOLN
INTRAMUSCULAR | Status: DC | PRN
  Administered 2015-01-14: 2 mg via INTRAVENOUS

## 2015-01-14 MED ORDER — SUGAMMADEX SODIUM 200 MG/2ML IV SOLN
INTRAVENOUS | Status: DC | PRN
  Administered 2015-01-14: 200 mg via INTRAVENOUS

## 2015-01-14 MED ORDER — ONDANSETRON HCL 4 MG/2ML IJ SOLN
INTRAMUSCULAR | Status: DC | PRN
  Administered 2015-01-14: 4 mg via INTRAVENOUS

## 2015-01-14 MED ORDER — DIAZEPAM 5 MG PO TABS
5.0000 mg | ORAL_TABLET | Freq: Four times a day (QID) | ORAL | Status: DC | PRN
  Administered 2015-01-14: 10 mg via ORAL
  Administered 2015-01-14: 5 mg via ORAL
  Filled 2015-01-14: qty 2

## 2015-01-14 MED ORDER — SODIUM CHLORIDE 0.9 % IJ SOLN: 3.0000 mL | INTRAMUSCULAR | Status: DC | PRN

## 2015-01-14 MED ORDER — FENTANYL CITRATE (PF) 250 MCG/5ML IJ SOLN
INTRAMUSCULAR | Status: AC
  Filled 2015-01-14: qty 5

## 2015-01-14 MED ORDER — SODIUM CHLORIDE 0.9 % IJ SOLN
3.0000 mL | Freq: Two times a day (BID) | INTRAMUSCULAR | Status: DC
  Administered 2015-01-14: 3 mL via INTRAVENOUS

## 2015-01-14 MED ORDER — CEFAZOLIN SODIUM 1-5 GM-% IV SOLN
1.0000 g | Freq: Three times a day (TID) | INTRAVENOUS | Status: AC
  Administered 2015-01-14 (×2): 1 g via INTRAVENOUS
  Filled 2015-01-14 (×2): qty 50

## 2015-01-14 MED ORDER — HYDROCODONE-ACETAMINOPHEN 7.5-325 MG PO TABS
1.0000 | ORAL_TABLET | ORAL | Status: DC | PRN
  Administered 2015-01-14 – 2015-01-15 (×5): 2 via ORAL
  Filled 2015-01-14 (×5): qty 2

## 2015-01-14 MED ORDER — OXYCODONE-ACETAMINOPHEN 5-325 MG PO TABS
ORAL_TABLET | ORAL | Status: AC
  Filled 2015-01-14: qty 2

## 2015-01-14 MED ORDER — FLUTICASONE PROPIONATE 50 MCG/ACT NA SUSP
1.0000 | Freq: Every day | NASAL | Status: DC
  Filled 2015-01-14: qty 16

## 2015-01-14 MED ORDER — LACTATED RINGERS IV SOLN
INTRAVENOUS | Status: DC | PRN
  Administered 2015-01-14: 07:00:00 via INTRAVENOUS

## 2015-01-14 MED ORDER — ARTIFICIAL TEARS OP OINT
TOPICAL_OINTMENT | OPHTHALMIC | Status: DC | PRN
  Administered 2015-01-14: 1 via OPHTHALMIC

## 2015-01-14 MED ORDER — CYCLOBENZAPRINE HCL 10 MG PO TABS
10.0000 mg | ORAL_TABLET | Freq: Three times a day (TID) | ORAL | Status: DC | PRN
  Administered 2015-01-14 – 2015-01-15 (×2): 10 mg via ORAL
  Filled 2015-01-14 (×2): qty 1

## 2015-01-14 MED ORDER — PHENOL 1.4 % MT LIQD: 1.0000 | OROMUCOSAL | Status: DC | PRN

## 2015-01-14 MED ORDER — 0.9 % SODIUM CHLORIDE (POUR BTL) OPTIME
TOPICAL | Status: DC | PRN
  Administered 2015-01-14: 1000 mL

## 2015-01-14 MED ORDER — ONDANSETRON HCL 4 MG/2ML IJ SOLN: 4.0000 mg | Freq: Once | INTRAMUSCULAR | Status: DC | PRN

## 2015-01-14 MED ORDER — ACETAMINOPHEN 325 MG PO TABS: 650.0000 mg | ORAL_TABLET | ORAL | Status: DC | PRN

## 2015-01-14 MED ORDER — HYDROCODONE-ACETAMINOPHEN 5-325 MG PO TABS
1.0000 | ORAL_TABLET | ORAL | Status: DC | PRN
  Administered 2015-01-14: 2 via ORAL

## 2015-01-14 MED ORDER — MIDAZOLAM HCL 2 MG/2ML IJ SOLN
INTRAMUSCULAR | Status: AC
  Filled 2015-01-14: qty 4

## 2015-01-14 MED ORDER — BACLOFEN 10 MG PO TABS
10.0000 mg | ORAL_TABLET | Freq: Three times a day (TID) | ORAL | Status: DC
  Administered 2015-01-14 (×2): 10 mg via ORAL
  Filled 2015-01-14 (×6): qty 1

## 2015-01-14 MED ORDER — SODIUM CHLORIDE 0.9 % IV SOLN
250.0000 mL | INTRAVENOUS | Status: DC
  Administered 2015-01-14: 250 mL via INTRAVENOUS

## 2015-01-14 MED ORDER — HEMOSTATIC AGENTS (NO CHARGE) OPTIME
TOPICAL | Status: DC | PRN
  Administered 2015-01-14: 1 via TOPICAL

## 2015-01-14 MED ORDER — ROCURONIUM BROMIDE 100 MG/10ML IV SOLN
INTRAVENOUS | Status: DC | PRN
  Administered 2015-01-14: 50 mg via INTRAVENOUS

## 2015-01-14 MED ORDER — SUGAMMADEX SODIUM 200 MG/2ML IV SOLN
INTRAVENOUS | Status: AC
  Filled 2015-01-14: qty 2

## 2015-01-14 SURGICAL SUPPLY — 62 items
ADH SKN CLS APL DERMABOND .7 (GAUZE/BANDAGES/DRESSINGS) ×1
APL SKNCLS STERI-STRIP NONHPOA (GAUZE/BANDAGES/DRESSINGS) ×1
BAG DECANTER FOR FLEXI CONT (MISCELLANEOUS) ×3 IMPLANT
BENZOIN TINCTURE PRP APPL 2/3 (GAUZE/BANDAGES/DRESSINGS) ×3 IMPLANT
BLADE CLIPPER SURG (BLADE) IMPLANT
BONE MATRIX OSTEOCEL PRO MED (Bone Implant) ×2 IMPLANT
CLOSURE WOUND 1/2 X4 (GAUZE/BANDAGES/DRESSINGS) ×2
COVER BACK TABLE 24X17X13 BIG (DRAPES) IMPLANT
DERMABOND ADVANCED (GAUZE/BANDAGES/DRESSINGS) ×2
DERMABOND ADVANCED .7 DNX12 (GAUZE/BANDAGES/DRESSINGS) IMPLANT
DRAPE C-ARM 42X72 X-RAY (DRAPES) ×3 IMPLANT
DRAPE C-ARMOR (DRAPES) ×3 IMPLANT
DRAPE LAPAROTOMY 100X72X124 (DRAPES) ×3 IMPLANT
DRAPE POUCH INSTRU U-SHP 10X18 (DRAPES) ×3 IMPLANT
DRAPE SURG 17X23 STRL (DRAPES) ×6 IMPLANT
DRSG OPSITE POSTOP 3X4 (GAUZE/BANDAGES/DRESSINGS) ×2 IMPLANT
DRSG OPSITE POSTOP 4X6 (GAUZE/BANDAGES/DRESSINGS) ×4 IMPLANT
ELECT REM PT RETURN 9FT ADLT (ELECTROSURGICAL) ×3
ELECTRODE REM PT RTRN 9FT ADLT (ELECTROSURGICAL) ×1 IMPLANT
GAUZE SPONGE 4X4 16PLY XRAY LF (GAUZE/BANDAGES/DRESSINGS) IMPLANT
GLOVE BIO SURGEON STRL SZ8 (GLOVE) ×2 IMPLANT
GLOVE BIO SURGEON STRL SZ8.5 (GLOVE) ×2 IMPLANT
GLOVE ECLIPSE 9.0 STRL (GLOVE) ×3 IMPLANT
GLOVE EXAM NITRILE LRG STRL (GLOVE) IMPLANT
GLOVE EXAM NITRILE MD LF STRL (GLOVE) IMPLANT
GLOVE EXAM NITRILE XL STR (GLOVE) IMPLANT
GLOVE EXAM NITRILE XS STR PU (GLOVE) IMPLANT
GOWN STRL REUS W/ TWL LRG LVL3 (GOWN DISPOSABLE) IMPLANT
GOWN STRL REUS W/ TWL XL LVL3 (GOWN DISPOSABLE) ×2 IMPLANT
GOWN STRL REUS W/TWL 2XL LVL3 (GOWN DISPOSABLE) IMPLANT
GOWN STRL REUS W/TWL LRG LVL3 (GOWN DISPOSABLE)
GOWN STRL REUS W/TWL XL LVL3 (GOWN DISPOSABLE) ×6
GUIDEWIRE NITINOL BEVEL TIP (WIRE) ×4 IMPLANT
INTERLOCK LORDOTIC 12X22X50 (Bone Implant) IMPLANT
KIT BASIN OR (CUSTOM PROCEDURE TRAY) ×3 IMPLANT
KIT DILATOR XLIF 5 (KITS) IMPLANT
KIT NDL NVM5 EMG ELECT (KITS) IMPLANT
KIT NEEDLE NVM5 EMG ELECT (KITS) ×1 IMPLANT
KIT NEEDLE NVM5 EMG ELECTRODE (KITS) ×2
KIT ROOM TURNOVER OR (KITS) ×3 IMPLANT
KIT SURGICAL ACCESS MAXCESS 4 (KITS) ×2 IMPLANT
KIT XLIF (KITS) ×2
LIQUID BAND (GAUZE/BANDAGES/DRESSINGS) ×6 IMPLANT
LORDOTIC 12X22X50 (Bone Implant) ×3 IMPLANT
NDL I-PASS III (NEEDLE) IMPLANT
NEEDLE HYPO 22GX1.5 SAFETY (NEEDLE) ×3 IMPLANT
NEEDLE I-PASS III (NEEDLE) ×3 IMPLANT
NS IRRIG 1000ML POUR BTL (IV SOLUTION) ×3 IMPLANT
PACK LAMINECTOMY NEURO (CUSTOM PROCEDURE TRAY) ×3 IMPLANT
PUTTY BONE DBX 2.5 MIS (Bone Implant) ×2 IMPLANT
ROD RELINE MAS 5.5X55MM LORD (Rod) ×2 IMPLANT
SCREW LOCK RELINE 5.5 TULIP (Screw) ×4 IMPLANT
SCREW MAS RELINE 6.5X45 POLY (Screw) ×4 IMPLANT
SPONGE LAP 4X18 X RAY DECT (DISPOSABLE) IMPLANT
STRIP CLOSURE SKIN 1/2X4 (GAUZE/BANDAGES/DRESSINGS) ×3 IMPLANT
SUT VIC AB 2-0 CT1 18 (SUTURE) ×6 IMPLANT
SUT VIC AB 3-0 SH 8-18 (SUTURE) ×6 IMPLANT
TAPE CLOTH 3X10 TAN LF (GAUZE/BANDAGES/DRESSINGS) ×9 IMPLANT
TOWEL OR 17X24 6PK STRL BLUE (TOWEL DISPOSABLE) ×3 IMPLANT
TOWEL OR 17X26 10 PK STRL BLUE (TOWEL DISPOSABLE) ×3 IMPLANT
TRAY FOLEY W/METER SILVER 14FR (SET/KITS/TRAYS/PACK) ×3 IMPLANT
WATER STERILE IRR 1000ML POUR (IV SOLUTION) ×3 IMPLANT

## 2015-01-14 NOTE — Brief Op Note (Signed)
01/14/2015  9:49 AM  PATIENT:  Mario Morgan  49 y.o. male  PRE-OPERATIVE DIAGNOSIS:  DDD  POST-OPERATIVE DIAGNOSIS:  degenerative disc disease  PROCEDURE:  Procedure(s) with comments: Left L2-L3 XLIF with percutaneous pedicle screws (Left) - Left L2-L3 XLIF with percutaneous pedicle screws  SURGEON:  Surgeon(s) and Role:    * Earnie Larsson, MD - Primary    * Newman Pies, MD - Assisting  PHYSICIAN ASSISTANT:   ASSISTANTS:    ANESTHESIA:   general  EBL:  Total I/O In: 1000 [I.V.:1000] Out: 200 [Urine:200]  BLOOD ADMINISTERED:none  DRAINS: none   LOCAL MEDICATIONS USED:  MARCAINE     SPECIMEN:  No Specimen  DISPOSITION OF SPECIMEN:  N/A  COUNTS:  YES  TOURNIQUET:  * No tourniquets in log *  DICTATION: .Dragon Dictation  PLAN OF CARE: Admit to inpatient   PATIENT DISPOSITION:  PACU - hemodynamically stable.   Delay start of Pharmacological VTE agent (>24hrs) due to surgical blood loss or risk of bleeding: yes

## 2015-01-14 NOTE — Plan of Care (Signed)
Problem: Consults Goal: Diagnosis - Spinal Surgery Outcome: Completed/Met Date Met:  01/14/15 Thoraco/Lumbar Spine Fusion

## 2015-01-14 NOTE — Op Note (Signed)
Date of procedure: 01/14/2015  Date of dictation: Same  Service: Neurosurgery  Preoperative diagnosis: L2-L3 degenerative disc disease with central L2-L3 herniated mucous pulposis  Postoperative diagnosis: Same  Procedure Name: Left L2-L3 anterior lateral retroperitoneal interbody decompression and fusion utilizing interbody peek cage and morselized allograft and osteo-cell plus.  Left L2-L3 posterior percutaneous pedicle screw fixation  Surgeon:Mosella Kasa A.Edyn Popoca, M.D.  Asst. Surgeon: Arnoldo Morale  Anesthesia: General  Indication: 49 year old male with intractable back pain and intermittent lower extremity symptoms. Workup demonstrates evidence of a central disc herniation with annular disruption at L2-3. Patient is failed all efforts at conservative management. Provocative discography is positive at L2-3. Patient wishes to proceed with L2-3 decompression infusion. Patient well aware of mixed results with regard to fusion for back pain for disc degeneration. He wishes to proceed.  Operative note: After induction of anesthesia, patient position in the right lateral decubitus position and a properly padded. Patient's left flank was prepped and draped sterilely. Patient's bed was positioned so that he was flexed laterally. An incision was made in his left flank. Access was gained bluntly into the retroperitoneal space. An incision was made lateral overlying the L2-3 disc space. Using the finger and the retro-peroneal space a dilator was then passed to the psoas muscle and docked onto the lateral aspect of the disc of L2-3. This was confirmed by fluoroscopy. Intraoperative neural monitoring was done confirming there were no adjacent nerves. A guidewire was placed into the L2-3 disks space. Disc space was sequentially dilated. An self retainer retractor was placed and expanded slightly. Dilators were removed. Direct stimulation was then performed along the L2-3 disc space. Once again there were nor visible  nerves nowhere there any nerves was stimulated with direct stimulation. Disc spaces then incised with 15 blade. Discectomies and performed using pituitary rongeurs and various scraping curettes. Contralateral annular release was performed using Cobb elevators. Disc space was sized and a 12 mm lordotic implant was found to be most appropriate. A 12 mm x 50 mm x 22 mm cage was selected. This is packed with DBX putty and osteo-cell plus. The disc space was given a final curetting. Soft tissues removed and interspace. The guiding slides were placed. The cage was then impacted into position. Slides were removed. Cage was detached from the applier. Good position the cage in both the AP and lateral planes was confirmed by intraoperative fluoroscopy. Retractor was removed. No evidence, patient with this aspect of the procedure. Patient's bed was flattened. Using intraoperative fluoroscopy entry sites for left-sided percutaneous pedicle screw fixation was determined for L2-3. Stab incisions were made. Jamshidi needles were then introduced into the pedicle under fluoroscopic guidance and using intraoperative nerve monitoring. Each Jamshidi needle was passed into the vertebral body of L2 and L3 respectively. Guidewires are left in place. Pedicles were then tapped with a screw tap again using nerve monitoring and fluoroscopy throughout. 6.5 x 45 mm nuvasive pedicle screws were placed at L2 and L3. A rod was then passed from the screw heads at L2-L3. Locking caps and placed over the rod. Locking caps were then engaged with the construct are mild compression. Final images revealed good position of the cage and the pedicle screw construct at the proper upper level with normal alignment spine. Wounds were then irrigated MI solution and closed in a typical fashion. Steri-Strips and sterile dressing were applied. No apparent complications. Patient tolerated the procedure well and he returns to the recovery room postop.

## 2015-01-14 NOTE — Transfer of Care (Signed)
Immediate Anesthesia Transfer of Care Note  Patient: Mario Morgan  Procedure(s) Performed: Procedure(s) with comments: Left L2-L3 XLIF with percutaneous pedicle screws (Left) - Left L2-L3 XLIF with percutaneous pedicle screws  Patient Location: PACU  Anesthesia Type:General  Level of Consciousness: awake, oriented, sedated, patient cooperative and responds to stimulation  Airway & Oxygen Therapy: Patient Spontanous Breathing and Patient connected to nasal cannula oxygen  Post-op Assessment: Report given to RN, Post -op Vital signs reviewed and stable, Patient moving all extremities and Patient moving all extremities X 4  Post vital signs: Reviewed and stable  Last Vitals:  Filed Vitals:   01/14/15 0628  BP: 141/100  Pulse: 87  Temp: 36.7 C  Resp: 20    Complications: No apparent anesthesia complications

## 2015-01-14 NOTE — Anesthesia Preprocedure Evaluation (Signed)
Anesthesia Evaluation  Patient identified by MRN, date of birth, ID band Patient awake    Reviewed: Allergy & Precautions, NPO status , Patient's Chart, lab work & pertinent test results  Airway Mallampati: I  TM Distance: >3 FB Neck ROM: Full    Dental   Pulmonary former smoker,    Pulmonary exam normal        Cardiovascular hypertension, Pt. on medications Normal cardiovascular exam     Neuro/Psych Depression    GI/Hepatic GERD  Medicated and Controlled,  Endo/Other    Renal/GU      Musculoskeletal   Abdominal   Peds  Hematology   Anesthesia Other Findings   Reproductive/Obstetrics                             Anesthesia Physical Anesthesia Plan  ASA: II  Anesthesia Plan: General   Post-op Pain Management:    Induction: Intravenous  Airway Management Planned: Oral ETT  Additional Equipment:   Intra-op Plan:   Post-operative Plan: Extubation in OR  Informed Consent: I have reviewed the patients History and Physical, chart, labs and discussed the procedure including the risks, benefits and alternatives for the proposed anesthesia with the patient or authorized representative who has indicated his/her understanding and acceptance.     Plan Discussed with: CRNA and Surgeon  Anesthesia Plan Comments:         Anesthesia Quick Evaluation

## 2015-01-14 NOTE — Anesthesia Procedure Notes (Signed)
Procedure Name: Intubation Date/Time: 01/14/2015 8:08 AM Performed by: Jacquiline Doe A Pre-anesthesia Checklist: Patient identified, Timeout performed, Emergency Drugs available, Suction available and Patient being monitored Patient Re-evaluated:Patient Re-evaluated prior to inductionOxygen Delivery Method: Circle system utilized Preoxygenation: Pre-oxygenation with 100% oxygen Intubation Type: IV induction and Cricoid Pressure applied Ventilation: Mask ventilation without difficulty Laryngoscope Size: Mac and 4 Grade View: Grade I Tube type: Oral Tube size: 7.5 mm Number of attempts: 1 Airway Equipment and Method: Stylet Placement Confirmation: ETT inserted through vocal cords under direct vision and breath sounds checked- equal and bilateral Secured at: 22 cm Tube secured with: Tape Dental Injury: Teeth and Oropharynx as per pre-operative assessment

## 2015-01-14 NOTE — H&P (Signed)
  Mario Morgan is an 49 y.o. male.   Chief Complaint: Back pain HPI: 49 year old male with intractable back pain failing all conservative management. Occasional radiation into his anterior thighs left greater than right. Workup demonstrates evidence of significant disc degeneration with a central disc protrusion at L2-3. Patient underwent provocative discography which confirms L2-3 as a probable pain generator for his condition. Remainder of his lumbar spine looks healthy and normal. Patient presents now for L2-3 fusion in hopes of improving his symptoms.  Past Medical History  Diagnosis Date  . Hypertension   . Back pain   . History of kidney stones   . GERD (gastroesophageal reflux disease)     prilosec  . Numbness and tingling of left leg     Past Surgical History  Procedure Laterality Date  . Colonoscopy    . Cysto Right 2006  . Lithotripsy      Family History  Problem Relation Age of Onset  . Healthy Mother    Social History:  reports that he quit smoking about 10 years ago. He does not have any smokeless tobacco history on file. He reports that he drinks alcohol. He reports that he uses illicit drugs (Marijuana).  Allergies: No Known Allergies  Medications Prior to Admission  Medication Sig Dispense Refill  . acetaminophen (TYLENOL) 500 MG tablet Take 1,000 mg by mouth every 6 (six) hours as needed for moderate pain or headache.    . baclofen (LIORESAL) 10 MG tablet TAKE 1 TABLET BY MOUTH 3 TIMES A DAY AS NEEDED. (Patient taking differently: TAKE 10 MG BY MOUTH 3 TIMES A DAY AS NEEDED FOR PAIN) 60 tablet 1  . cyclobenzaprine (FLEXERIL) 10 MG tablet Take 10 mg by mouth 3 (three) times daily as needed for muscle spasms.    . fluticasone (FLONASE) 50 MCG/ACT nasal spray Place 2 sprays into both nostrils daily. (Patient taking differently: Place 1-2 sprays into both nostrils daily. ) 16 g 11  . HYDROcodone-acetaminophen (NORCO/VICODIN) 5-325 MG tablet Take 2 tablets by mouth  every 6 (six) hours as needed for moderate pain.   0  . lisinopril (PRINIVIL,ZESTRIL) 10 MG tablet Take 10 mg by mouth daily.    Marland Kitchen omeprazole (PRILOSEC) 20 MG capsule Take 20 mg by mouth daily as needed (for acid reflux).     Marland Kitchen levofloxacin (LEVAQUIN) 750 MG tablet Take 1 tablet (750 mg total) by mouth daily. (Patient not taking: Reported on 01/03/2015) 10 tablet 0    No results found for this or any previous visit (from the past 48 hour(s)). No results found.  A comprehensive review of systems was negative.  Blood pressure 141/100, pulse 87, temperature 98.1 F (36.7 C), temperature source Oral, resp. rate 20, weight 87.544 kg (193 lb), SpO2 99 %.  The patient is awake and alert. He is oriented and appropriate. Speech is fluent. Cranial nerve function is intact. Motor and sensory function extremities normal. Reflexes normal. Gait normal. Examination head ears eyes and there is unremarkable. Chest and abdomen are benign. Extremities free from injury deformity.  Assessment/Plan L2-3 degenerative disc disease with intractable back pain. Plan left L2-3 anterior lateral retroperitoneal interbody fusion utilizing interbody peek cages and morselized allograft, supplemented with posterior pedicle screw fixation utilizing percutaneous pedicle screw fixation. Risks and benefits of been explained. Patient wishes to proceed.  Daquisha Clermont A 01/14/2015, 7:40 AM

## 2015-01-14 NOTE — Care Management (Signed)
Utilization review completed. Kaleea Penner, RN Case Manager [phone]. 

## 2015-01-14 NOTE — Anesthesia Postprocedure Evaluation (Signed)
Anesthesia Post Note  Patient: Mario Morgan  Procedure(s) Performed: Procedure(s) (LRB): Left L2-L3 XLIF with percutaneous pedicle screws (Left)  Anesthesia type: general  Patient location: PACU  Post pain: Pain level controlled  Post assessment: Patient's Cardiovascular Status Stable  Last Vitals:  Filed Vitals:   01/14/15 1119  BP: 134/90  Pulse: 84  Temp: 36.9 C  Resp:     Post vital signs: Reviewed and stable  Level of consciousness: sedated  Complications: No apparent anesthesia complications

## 2015-01-15 ENCOUNTER — Encounter (HOSPITAL_COMMUNITY): Payer: Self-pay | Admitting: Neurosurgery

## 2015-01-15 MED ORDER — HYDROCODONE-ACETAMINOPHEN 7.5-325 MG PO TABS: 1.0000 | ORAL_TABLET | ORAL | Status: DC | PRN

## 2015-01-15 MED ORDER — DIAZEPAM 5 MG PO TABS: 5.0000 mg | ORAL_TABLET | Freq: Four times a day (QID) | ORAL | Status: DC | PRN

## 2015-01-15 NOTE — Progress Notes (Signed)
Patient alert and oriented, mae's well, voiding adequate amount of urine, swallowing without difficulty,Pain is controlled with po pain med. Patient discharged home with spouse. Script and discharged instructions given to patient. Patient and spouse stated understanding of d/c instructions given and has an appointment with MD.

## 2015-01-15 NOTE — Discharge Summary (Signed)
Physician Discharge Summary  Patient ID: Mario Morgan MRN: [id number] DOB/AGE: 49/21/1967 49 y.o.  Admit date: 01/14/2015 Discharge date: 01/15/2015  Admission Diagnoses:  Discharge Diagnoses:  Principal Problem:   DDD (degenerative disc disease), lumbar   Discharged Condition: good  Hospital Course: Patient admitted to the hospital where he underwent an uncomplicated O0-3 decompression and fusion. Postoperatively he is doing very well. Preoperative back and lower extremity symptoms improved. Standing and walking without difficulty. Ready for discharge home.   Consults:   Significant Diagnostic Studies:   Treatments:   Discharge Exam: Blood pressure 137/80, pulse 90, temperature 98.2 F (36.8 C), temperature source Oral, resp. rate 18, weight 87.544 kg (193 lb), SpO2 100 %. Awake and alert. Oriented and appropriate. Motor and sensory function intact. Wound clean and dry. Chest and abdomen benign.  Disposition: Final discharge disposition not confirmed     Medication List    TAKE these medications        acetaminophen 500 MG tablet  Commonly known as:  TYLENOL  Take 1,000 mg by mouth every 6 (six) hours as needed for moderate pain or headache.     baclofen 10 MG tablet  Commonly known as:  LIORESAL  TAKE 1 TABLET BY MOUTH 3 TIMES A DAY AS NEEDED.     cyclobenzaprine 10 MG tablet  Commonly known as:  FLEXERIL  Take 10 mg by mouth 3 (three) times daily as needed for muscle spasms.     diazepam 5 MG tablet  Commonly known as:  VALIUM  Take 1 tablet (5 mg total) by mouth every 6 (six) hours as needed for anxiety.     fluticasone 50 MCG/ACT nasal spray  Commonly known as:  FLONASE  Place 2 sprays into both nostrils daily.     HYDROcodone-acetaminophen 5-325 MG tablet  Commonly known as:  NORCO/VICODIN  Take 2 tablets by mouth every 6 (six) hours as needed for moderate pain.     HYDROcodone-acetaminophen 7.5-325 MG tablet  Commonly known as:  NORCO  Take 1-2  tablets by mouth every 4 (four) hours as needed for moderate pain.     levofloxacin 750 MG tablet  Commonly known as:  LEVAQUIN  Take 1 tablet (750 mg total) by mouth daily.     lisinopril 10 MG tablet  Commonly known as:  PRINIVIL,ZESTRIL  Take 10 mg by mouth daily.     omeprazole 20 MG capsule  Commonly known as:  PRILOSEC  Take 20 mg by mouth daily as needed (for acid reflux).           Follow-up Information    Follow up with Charlie Pitter, MD.   Specialty:  Neurosurgery   Contact information:   [address] [place] [address] [phone]       Signed: Charlie Pitter 01/15/2015, 9:39 AM

## 2015-01-15 NOTE — Evaluation (Addendum)
Physical Therapy Evaluation Patient Details Name: Mario Morgan MRN: [id number] DOB: May 31, 1965 Today's Date: 01/15/2015   History of Present Illness  Pt is a 49 y/o male who presents s/p L2-3 anterior lateral lumbar fusion on 01/14/15.  Clinical Impression  Pt admitted with above diagnosis. Pt currently with functional limitations due to the deficits listed below (see PT Problem List). At the time of PT eval pt was able to perform transfers and ambulation with supervision to modified independence. 1 minor LOB in hall due to pt "stepping wrong" on the side of his foot. No other balance deficits noted. Pt will benefit from skilled PT to increase their independence and safety with mobility to allow discharge to the venue listed below.       Follow Up Recommendations No PT follow up    Equipment Recommendations  None recommended by PT    Recommendations for Other Services       Precautions / Restrictions Precautions Precautions: Fall;Back Precaution Booklet Issued: Yes (comment) Precaution Comments: Discussed 3/3 back precautions with pt. Required Braces or Orthoses: Spinal Brace Spinal Brace: Lumbar corset;Applied in sitting position Restrictions Weight Bearing Restrictions: No      Mobility  Bed Mobility Overal bed mobility: Modified Independent             General bed mobility comments: VC's for log roll. Pt able to perform well.   Transfers Overall transfer level: Modified independent Equipment used: None Transfers: Sit to/from Stand           General transfer comment: Increased time to complete. VC's for scoot out to EOB, but no physical assist required.   Ambulation/Gait Ambulation/Gait assistance: Supervision;Min assist Ambulation Distance (Feet): 400 Feet Assistive device: None Gait Pattern/deviations: WFL(Within Functional Limits) Gait velocity: Decreased Gait velocity interpretation: Below normal speed for age/gender General Gait Details: Pt with  slow and somewhat guarded gait. Mostly supervision level with one instance of min assist required for LOB. Pt states he "stepped on the outside of his foot" and lost balance.   Stairs Stairs: Yes Stairs assistance: Supervision Stair Management: No rails;One rail Right;Alternating pattern;Step to pattern;Forwards Number of Stairs: 10 General stair comments: Pt cued for step-to pattern and no rails to simulate home environment. Pt began with no rails and then held onto R rail for support as he progressed to alternating step pattern.   Wheelchair Mobility    Modified Rankin (Stroke Patients Only)       Balance Overall balance assessment: Needs assistance Sitting-balance support: Feet supported;No upper extremity supported Sitting balance-Leahy Scale: Normal     Standing balance support: No upper extremity supported;During functional activity Standing balance-Leahy Scale: Fair Standing balance comment: 1 LOB during gait training but otherwise supervision level                             Pertinent Vitals/Pain Pain Assessment: 0-10 Pain Score: 3  Pain Location: Incisional site Pain Descriptors / Indicators: Operative site guarding Pain Intervention(s): Limited activity within patient's tolerance;Monitored during session;Repositioned    Home Living Family/patient expects to be discharged to:: Private residence Living Arrangements: Spouse/significant other Available Help at Discharge: Family;Available 24 hours/day Type of Home: House Home Access: Stairs to enter Entrance Stairs-Rails: None Entrance Stairs-Number of Steps: 3 Home Layout: One level Home Equipment: Shower seat - built in      Prior Function Level of Independence: Independent  Hand Dominance   Dominant Hand: Right    Extremity/Trunk Assessment   Upper Extremity Assessment: Defer to OT evaluation           Lower Extremity Assessment: Overall WFL for tasks assessed       Cervical / Trunk Assessment: Normal  Communication   Communication: No difficulties  Cognition Arousal/Alertness: Awake/alert Behavior During Therapy: WFL for tasks assessed/performed Overall Cognitive Status: Within Functional Limits for tasks assessed                      General Comments      Exercises        Assessment/Plan    PT Assessment Patient needs continued PT services  PT Diagnosis Difficulty walking;Acute pain   PT Problem List Decreased strength;Decreased range of motion;Decreased activity tolerance;Decreased balance;Decreased mobility;Decreased knowledge of use of DME;Decreased safety awareness;Decreased knowledge of precautions;Pain  PT Treatment Interventions DME instruction;Gait training;Stair training;Functional mobility training;Therapeutic activities;Therapeutic exercise;Neuromuscular re-education;Patient/family education   PT Goals (Current goals can be found in the Care Plan section) Acute Rehab PT Goals Patient Stated Goal: Home today PT Goal Formulation: With patient Time For Goal Achievement: 01/22/15 Potential to Achieve Goals: Good    Frequency Min 5X/week   Barriers to discharge        Co-evaluation               End of Session Equipment Utilized During Treatment: Back brace Activity Tolerance: Patient tolerated treatment well Patient left: in chair;with call bell/phone within reach Nurse Communication: Mobility status         Time: 0800-0832 PT Time Calculation (min) (ACUTE ONLY): 32 min   Charges:   PT Evaluation $Initial PT Evaluation Tier I: 1 Procedure PT Treatments $Gait Training: 8-22 mins   PT G Codes:        Rolinda Roan 2015/01/26, 9:10 AM   Rolinda Roan, PT, DPT Acute Rehabilitation Services Pager: [phone]

## 2015-01-15 NOTE — Evaluation (Signed)
Occupational Therapy Evaluation Patient Details Name: Mario Morgan MRN: [id number] DOB: 1965/09/26 Today's Date: 01/15/2015    History of Present Illness Pt is a 49 y/o male who presents s/p L2-3 anterior lateral lumbar fusion on 01/14/15.   Clinical Impression   Pt able to perform ADL at a modified independent level.  Educated pt at length in back precautions related to ADL and IADL.  Pt verbalizing understanding.  No further OT needs.    Follow Up Recommendations  No OT follow up    Equipment Recommendations  None recommended by OT    Recommendations for Other Services       Precautions / Restrictions Precautions Precautions: Fall;Back Precaution Booklet Issued: Yes (comment) Precaution Comments: Discussed 3/3 back precautions with pt. Required Braces or Orthoses: Spinal Brace Spinal Brace: Lumbar corset;Applied in sitting position Restrictions Weight Bearing Restrictions: No      Mobility Bed Mobility  Pt in chair.           Transfers Overall transfer level: Modified independent Equipment used: None Transfers: Sit to/from Stand           General transfer comment: increased time, use of UEs on arms of chair    Balance Overall balance assessment: Needs assistance Sitting-balance support: Feet supported;No upper extremity supported Sitting balance-Leahy Scale: Normal     Standing balance support: No upper extremity supported;During functional activity Standing balance-Leahy Scale: Fair Standing balance comment: 1 LOB during gait training but otherwise supervision level                            ADL Overall ADL's : Modified independent                                       General ADL Comments: Educated pt in back precaution related to ADL and IADL. Pt able to cross his foot over opposite knee to reach socks with pain. Can rely on his wife if pain precludes him from performing and use her bath sponge for washing feet  as necessary.     Vision     Perception     Praxis      Pertinent Vitals/Pain Pain Assessment: 0-10 Pain Score: 3  Pain Location: back Pain Descriptors / Indicators: Operative site guarding Pain Intervention(s): Limited activity within patient's tolerance;Monitored during session;Premedicated before session;Repositioned     Hand Dominance Right   Extremity/Trunk Assessment Upper Extremity Assessment Upper Extremity Assessment: Overall WFL for tasks assessed   Lower Extremity Assessment Lower Extremity Assessment: Overall WFL for tasks assessed   Cervical / Trunk Assessment Cervical / Trunk Assessment: Normal   Communication Communication Communication: No difficulties   Cognition Arousal/Alertness: Awake/alert Behavior During Therapy: WFL for tasks assessed/performed Overall Cognitive Status: Within Functional Limits for tasks assessed                     General Comments       Exercises       Shoulder Instructions      Home Living Family/patient expects to be discharged to:: Private residence Living Arrangements: Spouse/significant other Available Help at Discharge: Family;Available 24 hours/day Type of Home: House Home Access: Stairs to enter [employer] of Steps: 3 Entrance Stairs-Rails: None Home Layout: One level     Bathroom Shower/Tub: Occupational psychologist: Standard Bathroom Accessibility: Yes  Home Equipment: Shower seat - built Hotel manager: Long-handled sponge        Prior Functioning/Environment Level of Independence: Independent             OT Diagnosis: Generalized weakness;Acute pain   OT Problem List:     OT Treatment/Interventions:      OT Goals(Current goals can be found in the care plan section) Acute Rehab OT Goals Patient Stated Goal: Home today  OT Frequency:     Barriers to D/C:            Co-evaluation              End of Session  Equipment Utilized During Treatment: Back brace;Gait belt  Activity Tolerance: Patient tolerated treatment well Patient left: in chair;with call bell/phone within reach   Time: 0829-0902 OT Time Calculation (min): 33 min Charges:  OT General Charges $OT Visit: 1 Procedure OT Evaluation $Initial OT Evaluation Tier I: 1 Procedure OT Treatments $Self Care/Home Management : 8-22 mins G-Codes:    Malka So 01/15/2015, 9:34 AM [phone]

## 2015-01-15 NOTE — Discharge Instructions (Signed)

## 2015-04-02 ENCOUNTER — Other Ambulatory Visit: Payer: Self-pay | Admitting: Family Medicine

## 2015-04-25 ENCOUNTER — Ambulatory Visit (INDEPENDENT_AMBULATORY_CARE_PROVIDER_SITE_OTHER): Payer: BLUE CROSS/BLUE SHIELD | Admitting: Family Medicine

## 2015-04-25 ENCOUNTER — Telehealth: Payer: Self-pay

## 2015-04-25 ENCOUNTER — Encounter: Payer: Self-pay | Admitting: Family Medicine

## 2015-04-25 VITALS — BP 125/80 | HR 70 | Temp 98.1°F | Resp 16 | Ht 69.0 in | Wt 193.6 lb

## 2015-04-25 DIAGNOSIS — I10 Essential (primary) hypertension: Secondary | ICD-10-CM

## 2015-04-25 DIAGNOSIS — M5136 Other intervertebral disc degeneration, lumbar region: Secondary | ICD-10-CM

## 2015-04-25 DIAGNOSIS — D224 Melanocytic nevi of scalp and neck: Secondary | ICD-10-CM

## 2015-04-25 DIAGNOSIS — G4700 Insomnia, unspecified: Secondary | ICD-10-CM

## 2015-04-25 DIAGNOSIS — K2970 Gastritis, unspecified, without bleeding: Secondary | ICD-10-CM

## 2015-04-25 MED ORDER — TRAZODONE HCL 50 MG PO TABS: 25.0000 mg | ORAL_TABLET | Freq: Every evening | ORAL | Status: DC | PRN

## 2015-04-25 NOTE — Progress Notes (Signed)
Name: Mario Morgan   MRN: [id number]    DOB: 09/24/1965   Date:04/25/2015       Progress Note  Subjective  Chief Complaint  Chief Complaint  Patient presents with  . Hypertension    HPI Here for f/u of HBP.  BP at home in  120/80 range.  HE has had recent backl surgery (3 mos ago) with ago response and minimal back pain now.  He has a "mole" on back of neck that he feels is getting bigger.  No problem-specific assessment & plan notes found for this encounter.   Past Medical History  Diagnosis Date  . Hypertension   . Back pain   . History of kidney stones   . GERD (gastroesophageal reflux disease)     prilosec  . Numbness and tingling of left leg     Past Surgical History  Procedure Laterality Date  . Colonoscopy    . Cysto Right 2006  . Lithotripsy    . Anterior lateral lumbar fusion with percutaneous screw 1 level Left 01/14/2015    Procedure: Left L2-L3 XLIF with percutaneous pedicle screws;  Surgeon: Earnie Larsson, MD;  Location: [facility];  Service: Neurosurgery;  Laterality: Left;  Left L2-L3 XLIF with percutaneous pedicle screws    Family History  Problem Relation Age of Onset  . Healthy Mother     Social History   Social History  . Marital Status: Married    Spouse Name: N/A  . Number of Children: N/A  . Years of Education: N/A   Occupational History  . Not on file.   Social History Main Topics  . Smoking status: Former Smoker    Quit date: 10/23/2004  . Smokeless tobacco: Not on file  . Alcohol Use: Yes     Comment: weekly  . Drug Use: Yes    Special: Marijuana     Comment: last used week of 01/04/2015  . Sexual Activity: Not on file   Other Topics Concern  . Not on file   Social History Narrative     Current outpatient prescriptions:  .  acetaminophen (TYLENOL) 500 MG tablet, Take 1,000 mg by mouth every 6 (six) hours as needed for moderate pain or headache., Disp: , Rfl:  .  baclofen (LIORESAL) 10 MG tablet, TAKE 1 TABLET BY MOUTH 3  TIMES A DAY AS NEEDED. (Patient taking differently: TAKE 10 MG BY MOUTH 3 TIMES A DAY AS NEEDED FOR PAIN), Disp: 60 tablet, Rfl: 1 .  fluticasone (FLONASE) 50 MCG/ACT nasal spray, Place 2 sprays into both nostrils daily. (Patient taking differently: Place 1-2 sprays into both nostrils daily. ), Disp: 16 g, Rfl: 11 .  lisinopril (PRINIVIL,ZESTRIL) 10 MG tablet, TAKE 1 TABLET BY MOUTH AT BEDTIME., Disp: 90 tablet, Rfl: 2 .  omeprazole (PRILOSEC) 20 MG capsule, TAKE 1 CAPSULE BY MOUTH 2 TIMES A DAY., Disp: 60 capsule, Rfl: 5 .  HYDROcodone-acetaminophen (NORCO) 10-325 MG tablet, Reported on 04/25/2015, Disp: , Rfl: 0 .  traZODone (DESYREL) 50 MG tablet, Take 0.5-1 tablets (25-50 mg total) by mouth at bedtime as needed for sleep., Disp: 30 tablet, Rfl: 3  Allergies  Allergen Reactions  . Oak Bark [Quercus Robur]     Congestion     Review of Systems  Constitutional: Negative for fever, chills, weight loss and malaise/fatigue.  HENT: Negative for hearing loss.   Eyes: Negative for blurred vision and double vision.  Respiratory: Negative for cough, shortness of breath and wheezing.   Cardiovascular:  Negative for chest pain, palpitations and leg swelling.  Gastrointestinal: Negative for heartburn, abdominal pain and blood in stool.  Genitourinary: Negative for dysuria, urgency and frequency.  Musculoskeletal: Negative for myalgias and back pain.  Skin: Negative for rash.  Neurological: Negative for dizziness, tremors, weakness and headaches.      Objective  Filed Vitals:   04/25/15 0851 04/25/15 0920  BP: 120/80 125/80  Pulse: 70   Temp: 98.1 F (36.7 C)   TempSrc: Oral   Resp: 16   Height: 5\' 9"  (1.753 m)   Weight: 193 lb 9.6 oz (87.816 kg)     Physical Exam  Constitutional: He is oriented to person, place, and time and well-developed, well-nourished, and in no distress. No distress.  HENT:  Head: Normocephalic and atraumatic.  Eyes: Conjunctivae and EOM are normal. Pupils are  equal, round, and reactive to light. No scleral icterus.  Neck: Normal range of motion. Carotid bruit is not present. No thyromegaly present.  Cardiovascular: Normal rate, regular rhythm and normal heart sounds.  Exam reveals no gallop and no friction rub.   No murmur heard. Pulmonary/Chest: Effort normal and breath sounds normal. No respiratory distress. He has no wheezes. He has no rales.  Abdominal: Soft. Bowel sounds are normal. He exhibits no distension, no abdominal bruit and no mass. There is no tenderness.  Musculoskeletal: He exhibits no edema.  Lymphadenopathy:    He has no cervical adenopathy.  Neurological: He is alert and oriented to person, place, and time.  Skin:  An 8 x 5 mm flesh colored mole on back of neck  Vitals reviewed.      No results found for this or any previous visit (from the past 2160 hour(s)).   Assessment & Plan  Problem List Items Addressed This Visit      Cardiovascular and Mediastinum   Essential (primary) hypertension - Primary     Musculoskeletal and Integument   DDD (degenerative disc disease), lumbar   Relevant Medications   HYDROcodone-acetaminophen (NORCO) 10-325 MG tablet   Nevus of neck   Relevant Orders   Ambulatory referral to Dermatology     Other   Cannot sleep   Relevant Medications   traZODone (DESYREL) 50 MG tablet      Meds ordered this encounter  Medications  . HYDROcodone-acetaminophen (NORCO) 10-325 MG tablet    Sig: Reported on 04/25/2015    Refill:  0  . traZODone (DESYREL) 50 MG tablet    Sig: Take 0.5-1 tablets (25-50 mg total) by mouth at bedtime as needed for sleep.    Dispense:  30 tablet    Refill:  3   1. Essential (primary) hypertension Cont Lisinopril  2. DDD (degenerative disc disease), lumbar   3. Cannot sleep  - traZODone (DESYREL) 50 MG tablet; Take 0.5-1 tablets (25-50 mg total) by mouth at bedtime as needed for sleep.  Dispense: 30 tablet; Refill: 3  4. Nevus of neck  - Ambulatory  referral to Dermatology

## 2015-04-25 NOTE — Telephone Encounter (Signed)
Called pt and left message about [facility] appt 07/03/2015 10:30. CP/STUDENT

## 2015-08-27 ENCOUNTER — Ambulatory Visit (INDEPENDENT_AMBULATORY_CARE_PROVIDER_SITE_OTHER): Payer: BLUE CROSS/BLUE SHIELD | Admitting: Family Medicine

## 2015-08-27 ENCOUNTER — Encounter: Payer: Self-pay | Admitting: Family Medicine

## 2015-08-27 VITALS — BP 125/80 | HR 78 | Temp 98.1°F | Resp 16 | Ht 69.0 in | Wt 186.0 lb

## 2015-08-27 DIAGNOSIS — M7651 Patellar tendinitis, right knee: Secondary | ICD-10-CM

## 2015-08-27 DIAGNOSIS — I10 Essential (primary) hypertension: Secondary | ICD-10-CM | POA: Diagnosis not present

## 2015-08-27 MED ORDER — MELOXICAM 15 MG PO TABS
15.0000 mg | ORAL_TABLET | Freq: Every day | ORAL | Status: DC
Start: 2015-08-27 — End: 2015-09-09

## 2015-08-27 MED ORDER — LISINOPRIL 10 MG PO TABS: 10.0000 mg | ORAL_TABLET | Freq: Every day | ORAL | Status: AC

## 2015-08-27 NOTE — Progress Notes (Signed)
Name: Mario Morgan   MRN: [id number]    DOB: 1965-05-15   Date:08/27/2015       Progress Note  Subjective  Chief Complaint  Chief Complaint  Patient presents with  . Hypertension    HPI Here for f/u of HBP.  Has recurrent back pain .  He is planning to contact Neurosurgeon about back.  Now with R knee pain x 5-6 wks.  Worse with riding bike.  No problem-specific assessment & plan notes found for this encounter.   Past Medical History  Diagnosis Date  . Hypertension   . Back pain   . History of kidney stones   . GERD (gastroesophageal reflux disease)     prilosec  . Numbness and tingling of left leg     Past Surgical History  Procedure Laterality Date  . Colonoscopy    . Cysto Right 2006  . Lithotripsy    . Anterior lateral lumbar fusion with percutaneous screw 1 level Left 01/14/2015    Procedure: Left L2-L3 XLIF with percutaneous pedicle screws;  Surgeon: Earnie Larsson, MD;  Location: [facility];  Service: Neurosurgery;  Laterality: Left;  Left L2-L3 XLIF with percutaneous pedicle screws    Family History  Problem Relation Age of Onset  . Healthy Mother     Social History   Social History  . Marital Status: Married    Spouse Name: N/A  . Number of Children: N/A  . Years of Education: N/A   Occupational History  . Not on file.   Social History Main Topics  . Smoking status: Former Smoker    Quit date: 10/23/2004  . Smokeless tobacco: Not on file  . Alcohol Use: Yes     Comment: weekly  . Drug Use: Yes    Special: Marijuana     Comment: last used week of 01/04/2015  . Sexual Activity: Not on file   Other Topics Concern  . Not on file   Social History Narrative     Current outpatient prescriptions:  .  acetaminophen (TYLENOL) 500 MG tablet, Take 1,000 mg by mouth every 6 (six) hours as needed for moderate pain or headache., Disp: , Rfl:  .  baclofen (LIORESAL) 10 MG tablet, TAKE 1 TABLET BY MOUTH 3 TIMES A DAY AS NEEDED. (Patient taking  differently: TAKE 10 MG BY MOUTH 3 TIMES A DAY AS NEEDED FOR PAIN), Disp: 60 tablet, Rfl: 1 .  fluticasone (FLONASE) 50 MCG/ACT nasal spray, Place 2 sprays into both nostrils daily., Disp: 16 g, Rfl: 11 .  HYDROcodone-acetaminophen (NORCO) 10-325 MG tablet, Reported on 04/25/2015, Disp: , Rfl: 0 .  lisinopril (PRINIVIL,ZESTRIL) 10 MG tablet, Take 1 tablet (10 mg total) by mouth at bedtime., Disp: 90 tablet, Rfl: 3 .  omeprazole (PRILOSEC) 20 MG capsule, TAKE 1 CAPSULE BY MOUTH 2 TIMES A DAY., Disp: 60 capsule, Rfl: 5 .  traZODone (DESYREL) 50 MG tablet, Take 0.5-1 tablets (25-50 mg total) by mouth at bedtime as needed for sleep., Disp: 30 tablet, Rfl: 3 .  meloxicam (MOBIC) 15 MG tablet, Take 1 tablet (15 mg total) by mouth daily., Disp: 30 tablet, Rfl: 3  Allergies  Allergen Reactions  . Oak Bark [Quercus Robur]     Congestion     Review of Systems  Constitutional: Negative for fever, chills, weight loss and malaise/fatigue.  HENT: Negative for hearing loss.   Eyes: Negative for blurred vision and double vision.  Respiratory: Negative for cough, shortness of breath and wheezing.  Cardiovascular: Negative for chest pain, palpitations and leg swelling.  Gastrointestinal: Negative for heartburn, abdominal pain and blood in stool.  Genitourinary: Negative for dysuria, urgency and frequency.  Musculoskeletal: Positive for back pain and joint pain (R knee).  Skin: Negative for rash.  Neurological: Negative for weakness and headaches.      Objective  Filed Vitals:   08/27/15 0834 08/27/15 0911  BP: 112/72 125/80  Pulse: 78   Temp: 98.1 F (36.7 C)   TempSrc: Oral   Resp: 16   Height: 5\' 9"  (1.753 m)   Weight: 186 lb (84.369 kg)     Physical Exam  Constitutional: He is oriented to person, place, and time and well-developed, well-nourished, and in no distress.  HENT:  Head: Normocephalic and atraumatic.  Eyes: Conjunctivae and EOM are normal. Pupils are equal, round, and  reactive to light. No scleral icterus.  Neck: Normal range of motion. Neck supple. No thyromegaly present.  Cardiovascular: Normal rate, regular rhythm and normal heart sounds.  Exam reveals no gallop and no friction rub.   No murmur heard. Pulmonary/Chest: Effort normal and breath sounds normal. No respiratory distress. He has no wheezes. He has no rales.  Abdominal: Soft. Bowel sounds are normal. He exhibits no distension and no mass. There is no tenderness.  Musculoskeletal: He exhibits no edema.  No pain to palpation of R patellar tendon today.  ROM without pain.  Lymphadenopathy:    He has no cervical adenopathy.  Neurological: He is alert and oriented to person, place, and time.  Vitals reviewed.      No results found for this or any previous visit (from the past 2160 hour(s)).   Assessment & Plan  Problem List Items Addressed This Visit      Cardiovascular and Mediastinum   Essential (primary) hypertension - Primary   Relevant Medications   lisinopril (PRINIVIL,ZESTRIL) 10 MG tablet     Musculoskeletal and Integument   Patellar tendonitis of right knee   Relevant Medications   meloxicam (MOBIC) 15 MG tablet      Meds ordered this encounter  Medications  . meloxicam (MOBIC) 15 MG tablet    Sig: Take 1 tablet (15 mg total) by mouth daily.    Dispense:  30 tablet    Refill:  3  . lisinopril (PRINIVIL,ZESTRIL) 10 MG tablet    Sig: Take 1 tablet (10 mg total) by mouth at bedtime.    Dispense:  90 tablet    Refill:  3   1. Essential (primary) hypertension  - lisinopril (PRINIVIL,ZESTRIL) 10 MG tablet; Take 1 tablet (10 mg total) by mouth at bedtime.  Dispense: 90 tablet; Refill: 3  2. Patellar tendonitis of right knee  - meloxicam (MOBIC) 15 MG tablet; Take 1 tablet (15 mg total) by mouth daily.  Dispense: 30 tablet; Refill: 3

## 2015-09-09 ENCOUNTER — Encounter: Payer: Self-pay | Admitting: Family Medicine

## 2015-09-09 ENCOUNTER — Ambulatory Visit (INDEPENDENT_AMBULATORY_CARE_PROVIDER_SITE_OTHER): Payer: BLUE CROSS/BLUE SHIELD | Admitting: Family Medicine

## 2015-09-09 VITALS — BP 120/86 | HR 63 | Temp 97.9°F | Resp 16 | Ht 69.0 in | Wt 184.0 lb

## 2015-09-09 DIAGNOSIS — J0101 Acute recurrent maxillary sinusitis: Secondary | ICD-10-CM

## 2015-09-09 MED ORDER — CEPHALEXIN 500 MG PO CAPS: 500.0000 mg | ORAL_CAPSULE | Freq: Four times a day (QID) | ORAL | Status: DC

## 2015-09-09 MED ORDER — FLUTICASONE PROPIONATE 50 MCG/ACT NA SUSP: 2.0000 | Freq: Every day | NASAL | Status: AC

## 2015-09-09 NOTE — Progress Notes (Signed)
Name: Mario Morgan   MRN: [id number]    DOB: [birth year]   Date:09/09/2015       Progress Note  Subjective  Chief Complaint  Chief Complaint  Patient presents with  . Sinusitis    onset 6 days no chills or fever weak sinus pressure and HA    HPI Here c/o sinus pressure and fatigue x past week.  No  Fever.  He has had this recurrently.  Augmentin has failed past 2 time he has had infections.  Mild nasal congestion.  Has allergies to oak. No problem-specific assessment & plan notes found for this encounter.   Past Medical History  Diagnosis Date  . Hypertension   . Back pain   . History of kidney stones   . GERD (gastroesophageal reflux disease)     prilosec  . Numbness and tingling of left leg     Social History  Substance Use Topics  . Smoking status: Former Smoker    Quit date: 10/23/2004  . Smokeless tobacco: Not on file  . Alcohol Use: Yes     Comment: weekly     Current outpatient prescriptions:  .  cetirizine (ZYRTEC ALLERGY) 10 MG tablet, Take 10 mg by mouth daily., Disp: , Rfl:  .  dextromethorphan-guaiFENesin (MUCINEX DM) 30-600 MG 12hr tablet, Take 1 tablet by mouth 2 (two) times daily., Disp: , Rfl:  .  lisinopril (PRINIVIL,ZESTRIL) 10 MG tablet, Take 1 tablet (10 mg total) by mouth at bedtime., Disp: 90 tablet, Rfl: 3 .  omeprazole (PRILOSEC) 20 MG capsule, TAKE 1 CAPSULE BY MOUTH 2 TIMES A DAY., Disp: 60 capsule, Rfl: 5  Allergies  Allergen Reactions  . Oak Bark [Quercus Robur]     Congestion    Review of Systems  Constitutional: Positive for malaise/fatigue. Negative for fever, chills and weight loss.  HENT: Positive for congestion. Negative for hearing loss.   Eyes: Negative for blurred vision and double vision.  Respiratory: Negative.   Cardiovascular: Negative.   Gastrointestinal: Negative.   Genitourinary: Negative.   Musculoskeletal: Positive for back pain.  Skin: Negative for rash.  Neurological: Negative for weakness and headaches.       Objective  Filed Vitals:   09/09/15 1257  BP: 120/86  Pulse: 63  Temp: 97.9 F (36.6 C)  TempSrc: Oral  Resp: 16  Height: 5\' 9"  (1.753 m)  Weight: 184 lb (83.462 kg)  SpO2: 100%     Physical Exam  Constitutional: He is well-developed, well-nourished, and in no distress. No distress.  HENT:  Head: Normocephalic and atraumatic.  Right Ear: External ear normal.  Left Ear: External ear normal.  Nose: Mucosal edema and rhinorrhea (clear) present. Right sinus exhibits maxillary sinus tenderness. Right sinus exhibits no frontal sinus tenderness. Left sinus exhibits maxillary sinus tenderness. Left sinus exhibits no frontal sinus tenderness.  Mouth/Throat: Oropharynx is clear and moist.  Eyes: Conjunctivae and EOM are normal. Pupils are equal, round, and reactive to light. No scleral icterus.  Pulmonary/Chest: Effort normal and breath sounds normal. No respiratory distress. He has no wheezes. He has no rales.  Vitals reviewed.     No results found for this or any previous visit (from the past 2160 hour(s)).   Assessment & Plan  1. Acute recurrent maxillary sinusitis  - cephALEXin (KEFLEX) 500 MG capsule; Take 1 capsule (500 mg total) by mouth 4 (four) times daily.  Dispense: 40 capsule; Refill: 0 - fluticasone (FLONASE) 50 MCG/ACT nasal spray; Place 2 sprays into both nostrils  daily.  Dispense: 16 g; Refill: 6

## 2015-09-25 ENCOUNTER — Telehealth: Payer: Self-pay | Admitting: *Deleted

## 2015-09-25 DIAGNOSIS — J0110 Acute frontal sinusitis, unspecified: Secondary | ICD-10-CM

## 2015-09-25 MED ORDER — LEVOFLOXACIN 500 MG PO TABS
500.0000 mg | ORAL_TABLET | Freq: Every day | ORAL | Status: DC
Start: 2015-09-25 — End: 2015-10-14

## 2015-09-25 NOTE — Telephone Encounter (Signed)
We can change his abx to levaquin 500mg  once daily for 7 days. Sent for pharmacy on file. Please alert the patient. If symptoms do no improve after this treatment, he should come back to be seen. Thanks! AK

## 2015-09-25 NOTE — Telephone Encounter (Signed)
Patient notified.[place] 

## 2015-09-25 NOTE — Telephone Encounter (Signed)
Patient was seen by Dr. Luan Pulling on 6/19 for sinus infection. He was given Keflex x 4 days but symptom have not improved. Please advise.

## 2015-10-14 ENCOUNTER — Ambulatory Visit (INDEPENDENT_AMBULATORY_CARE_PROVIDER_SITE_OTHER): Payer: BLUE CROSS/BLUE SHIELD | Admitting: Family Medicine

## 2015-10-14 ENCOUNTER — Encounter: Payer: Self-pay | Admitting: Family Medicine

## 2015-10-14 ENCOUNTER — Ambulatory Visit: Payer: BLUE CROSS/BLUE SHIELD | Admitting: Family Medicine

## 2015-10-14 VITALS — BP 120/80 | HR 71 | Temp 98.0°F | Resp 16 | Ht 69.0 in | Wt 187.0 lb

## 2015-10-14 DIAGNOSIS — J0101 Acute recurrent maxillary sinusitis: Secondary | ICD-10-CM

## 2015-10-14 MED ORDER — PREDNISONE 20 MG PO TABS: 40.0000 mg | ORAL_TABLET | Freq: Every day | ORAL | 0 refills | Status: AC

## 2015-10-14 MED ORDER — AMOXICILLIN 875 MG PO TABS: 875.0000 mg | ORAL_TABLET | Freq: Two times a day (BID) | ORAL | 0 refills | Status: AC

## 2015-10-14 MED ORDER — PSEUDOEPHEDRINE HCL 60 MG PO TABS: 60.0000 mg | ORAL_TABLET | Freq: Three times a day (TID) | ORAL | 0 refills | Status: AC | PRN

## 2015-10-14 NOTE — Progress Notes (Signed)
Subjective:    Patient ID: Mario Morgan, male    DOB: 05/29/1965, 50 y.o.   MRN: [id number]  HPI: Mario Morgan is a 50 y.o. male presenting on 10/14/2015 for Sinusitis (HA drowsy tired meds didn't improve Sx)   HPI  Pt presents recurrent sinus infection. Has been through 2 rounds of anti-biotics. He took Keflex starting 6/19 for sinus infection and failed treatment. Started levaquin on 7/5- mildly improved but never really got better. Facial pressure and swelling under the eyes. Some fatigue. Sinus HA.  Home treatment: Neti-pot, mucinex.  No decongestant.   Past Medical History:  Diagnosis Date  . Back pain   . GERD (gastroesophageal reflux disease)    prilosec  . History of kidney stones   . Hypertension   . Numbness and tingling of left leg     Current Outpatient Prescriptions on File Prior to Visit  Medication Sig  . cetirizine (ZYRTEC ALLERGY) 10 MG tablet Take 10 mg by mouth daily.  Marland Kitchen dextromethorphan-guaiFENesin (MUCINEX DM) 30-600 MG 12hr tablet Take 1 tablet by mouth 2 (two) times daily.  . fluticasone (FLONASE) 50 MCG/ACT nasal spray Place 2 sprays into both nostrils daily.  Marland Kitchen lisinopril (PRINIVIL,ZESTRIL) 10 MG tablet Take 1 tablet (10 mg total) by mouth at bedtime.  Marland Kitchen omeprazole (PRILOSEC) 20 MG capsule TAKE 1 CAPSULE BY MOUTH 2 TIMES A DAY.   No current facility-administered medications on file prior to visit.     Review of Systems  Constitutional: Positive for fatigue. Negative for fever.  HENT: Positive for congestion, facial swelling and sinus pressure.   Respiratory: Negative for shortness of breath and wheezing.    Per HPI unless specifically indicated above     Objective:    BP 120/80 (BP Location: Right Arm, Patient Position: Sitting, Cuff Size: Normal)   Pulse 71   Temp 98 F (36.7 C) (Oral)   Resp 16   Ht 5\' 9"  (1.753 m)   Wt 187 lb (84.8 kg)   SpO2 100%   BMI 27.62 kg/m   Wt Readings from Last 3 Encounters:  10/14/15 187 lb (84.8 kg)   09/09/15 184 lb (83.5 kg)  08/27/15 186 lb (84.4 kg)    Physical Exam  HENT:  Head: Normocephalic and atraumatic.  Right Ear: Hearing and tympanic membrane normal.  Left Ear: Hearing normal. A middle ear effusion is present.  Nose: Mucosal edema present. No rhinorrhea. Right sinus exhibits maxillary sinus tenderness. Left sinus exhibits maxillary sinus tenderness.  Mouth/Throat: Uvula is midline and oropharynx is clear and moist.  Eyes: Conjunctivae and EOM are normal. Pupils are equal, round, and reactive to light.  Neck: Normal range of motion. Neck supple.  Pulmonary/Chest: Effort normal and breath sounds normal. No respiratory distress. He has no wheezes. He has no rales. He exhibits no tenderness.   Results for orders placed or performed during the hospital encounter of 01/04/15  Surgical pcr screen  Result Value Ref Range   MRSA, PCR NEGATIVE NEGATIVE   Staphylococcus aureus NEGATIVE NEGATIVE  Comprehensive metabolic panel  Result Value Ref Range   Sodium 137 135 - 145 mmol/L   Potassium 5.6 (H) 3.5 - 5.1 mmol/L   Chloride 104 101 - 111 mmol/L   CO2 24 22 - 32 mmol/L   Glucose, Bld 88 65 - 99 mg/dL   BUN 32 (H) 6 - 20 mg/dL   Creatinine, Ser 1.32 (H) 0.61 - 1.24 mg/dL   Calcium 10.2 8.9 - 10.3 mg/dL  Total Protein 7.7 6.5 - 8.1 g/dL   Albumin 4.9 3.5 - 5.0 g/dL   AST 30 15 - 41 U/L   ALT 30 17 - 63 U/L   Alkaline Phosphatase 75 38 - 126 U/L   Total Bilirubin 0.9 0.3 - 1.2 mg/dL   GFR calc non Af Amer >60 >60 mL/min   GFR calc Af Amer >60 >60 mL/min   Anion gap 9 5 - 15  CBC WITH DIFFERENTIAL  Result Value Ref Range   WBC 4.8 4.0 - 10.5 K/uL   RBC 5.32 4.22 - 5.81 MIL/uL   Hemoglobin 15.3 13.0 - 17.0 g/dL   HCT 45.9 39.0 - 52.0 %   MCV 86.3 78.0 - 100.0 fL   MCH 28.8 26.0 - 34.0 pg   MCHC 33.3 30.0 - 36.0 g/dL   RDW 13.0 11.5 - 15.5 %   Platelets 247 150 - 400 K/uL   Neutrophils Relative % 51 %   Neutro Abs 2.5 1.7 - 7.7 K/uL   Lymphocytes Relative 32 %    Lymphs Abs 1.5 0.7 - 4.0 K/uL   Monocytes Relative 12 %   Monocytes Absolute 0.6 0.1 - 1.0 K/uL   Eosinophils Relative 4 %   Eosinophils Absolute 0.2 0.0 - 0.7 K/uL   Basophils Relative 1 %   Basophils Absolute 0.0 0.0 - 0.1 K/uL  I-STAT 4, (NA,K, GLUC, HGB,HCT)  Result Value Ref Range   Sodium 140 135 - 145 mmol/L   Potassium 4.7 3.5 - 5.1 mmol/L   Glucose, Bld 93 65 - 99 mg/dL   HCT 45.0 39.0 - 52.0 %   Hemoglobin 15.3 13.0 - 17.0 g/dL  Type and screen  Result Value Ref Range   ABO/RH(D) A POS    Antibody Screen NEG    Sample Expiration 01/18/2015   ABO/Rh  Result Value Ref Range   ABO/RH(D) A POS       Assessment & Plan:   Problem List Items Addressed This Visit    None    Visit Diagnoses    Acute recurrent maxillary sinusitis    -  Primary   Recurrent sinusitis. Treat with sudafed and prednisone. Pt is requesting amoxil. If not improved will try amoxil. To ENT with further symptoms.    Relevant Medications   pseudoephedrine (SUDAFED) 60 MG tablet   predniSONE (DELTASONE) 20 MG tablet   amoxicillin (AMOXIL) 875 MG tablet      Meds ordered this encounter  Medications  . pseudoephedrine (SUDAFED) 60 MG tablet    Sig: Take 1 tablet (60 mg total) by mouth every 8 (eight) hours as needed.    Dispense:  30 tablet    Refill:  0    Order Specific Question:   Supervising Provider    Answer:   Arlis Porta [phone]  . predniSONE (DELTASONE) 20 MG tablet    Sig: Take 2 tablets (40 mg total) by mouth daily with breakfast.    Dispense:  10 tablet    Refill:  0    Order Specific Question:   Supervising Provider    Answer:   Arlis Porta [phone]  . amoxicillin (AMOXIL) 875 MG tablet    Sig: Take 1 tablet (875 mg total) by mouth 2 (two) times daily.    Dispense:  14 tablet    Refill:  0    Order Specific Question:   Supervising Provider    Answer:   Arlis Porta [phone]  Follow up plan: Return if symptoms worsen or fail to improve.

## 2015-10-14 NOTE — Patient Instructions (Signed)
I want you to take sudafed three times daily to help dry your sinuses out. Take prednisone one daily in the morning for 5 days. If your symptoms are not better in 2-3 days start taking the amoxicillin.   If your symptoms persist- please let me know and we will have you seen by an ENT.

## 2016-02-24 ENCOUNTER — Encounter: Payer: BLUE CROSS/BLUE SHIELD | Admitting: Family Medicine

## 2016-04-22 ENCOUNTER — Other Ambulatory Visit: Payer: Self-pay | Admitting: Family Medicine

## 2016-06-16 ENCOUNTER — Other Ambulatory Visit: Payer: Self-pay | Admitting: Neurosurgery

## 2016-06-16 DIAGNOSIS — M5136 Other intervertebral disc degeneration, lumbar region: Secondary | ICD-10-CM

## 2016-06-18 ENCOUNTER — Ambulatory Visit
Admission: RE | Admit: 2016-06-18 | Discharge: 2016-06-18 | Disposition: A | Discharge status: Home / Self Care | Payer: BLUE CROSS/BLUE SHIELD | Source: Ambulatory Visit | Attending: Neurosurgery | Admitting: Neurosurgery

## 2016-06-18 DIAGNOSIS — M5136 Other intervertebral disc degeneration, lumbar region: Secondary | ICD-10-CM

## 2016-12-09 IMAGING — RF DG C-ARM 61-120 MIN
1 series · 3 of 3 positions shown · non-contrast
Comparison: None.

CLINICAL DATA: Status post XLIF

EXAM:
DG C-ARM 61-120 MIN

[Series 1: run · 3 of 3 slices shown]
[im 1/3]
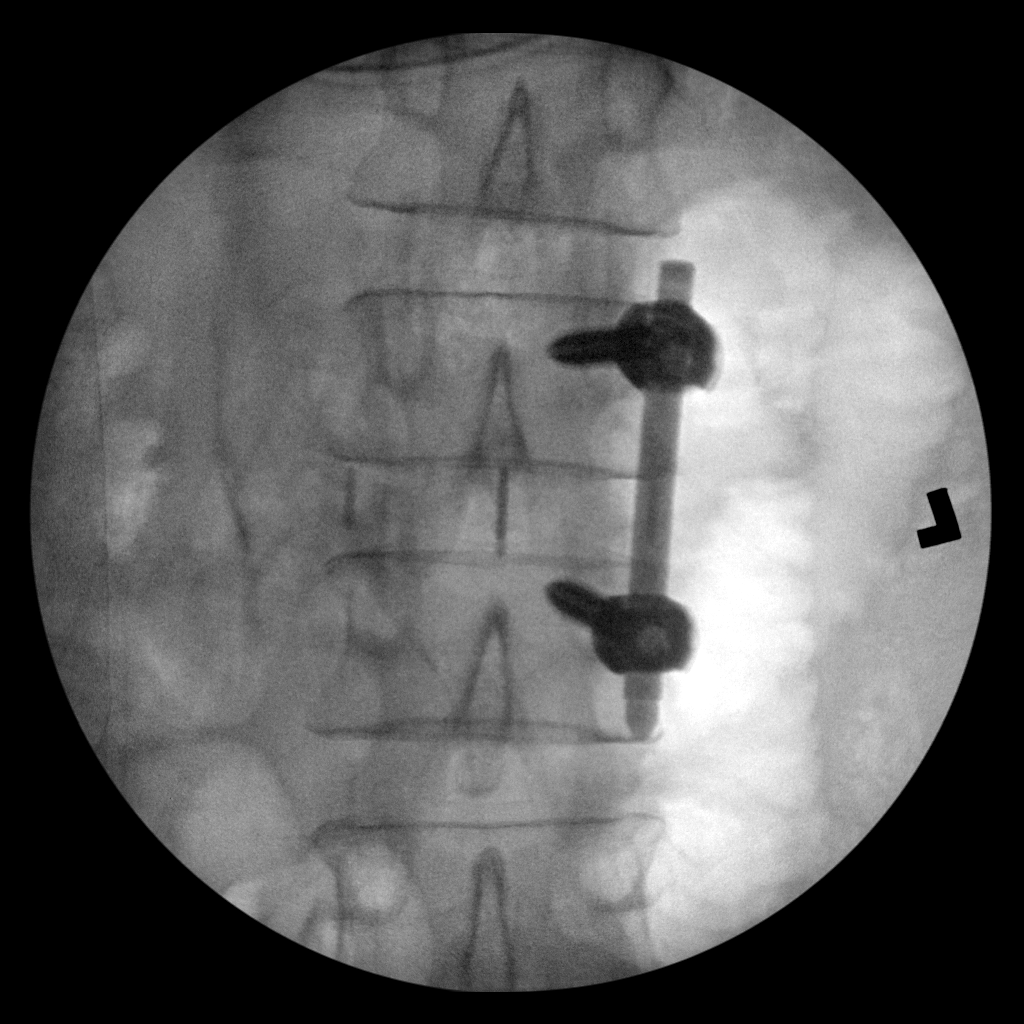
[im 2/3]
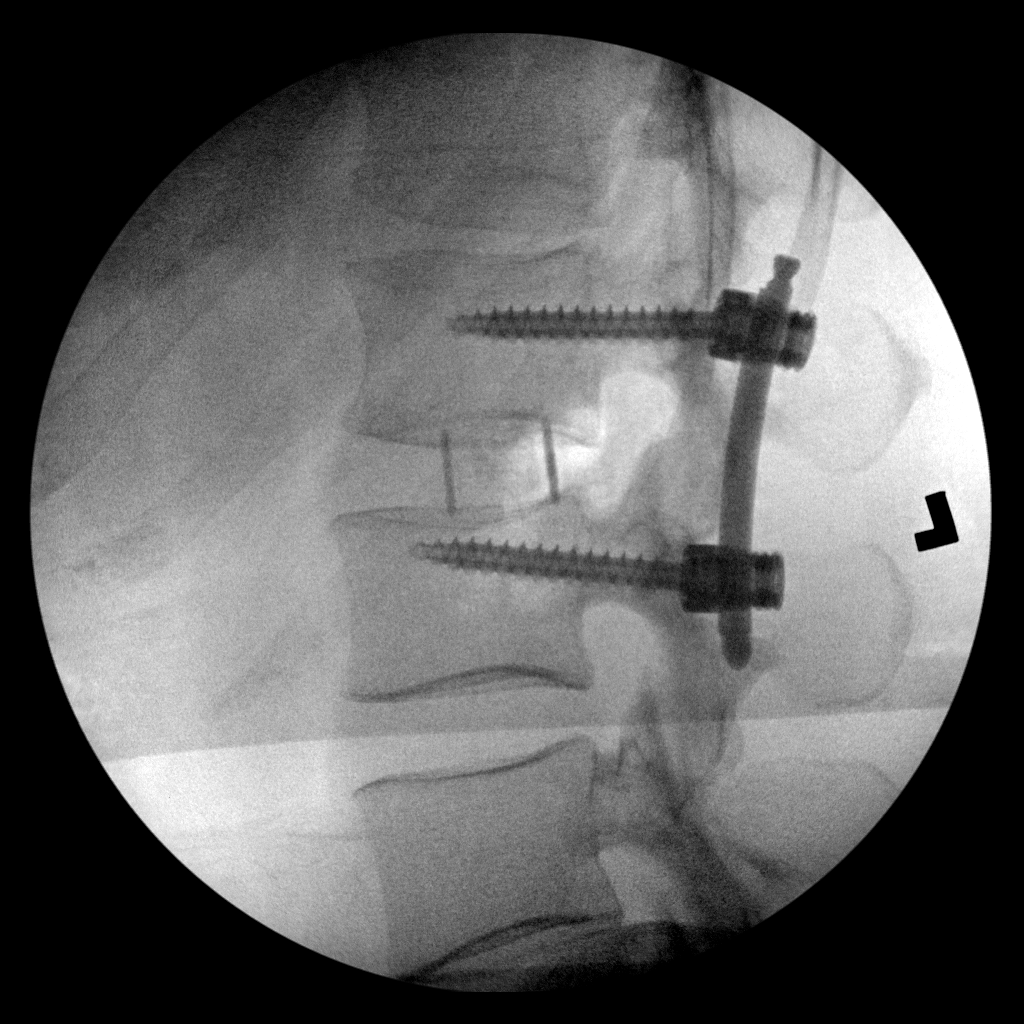
[im 3/3]
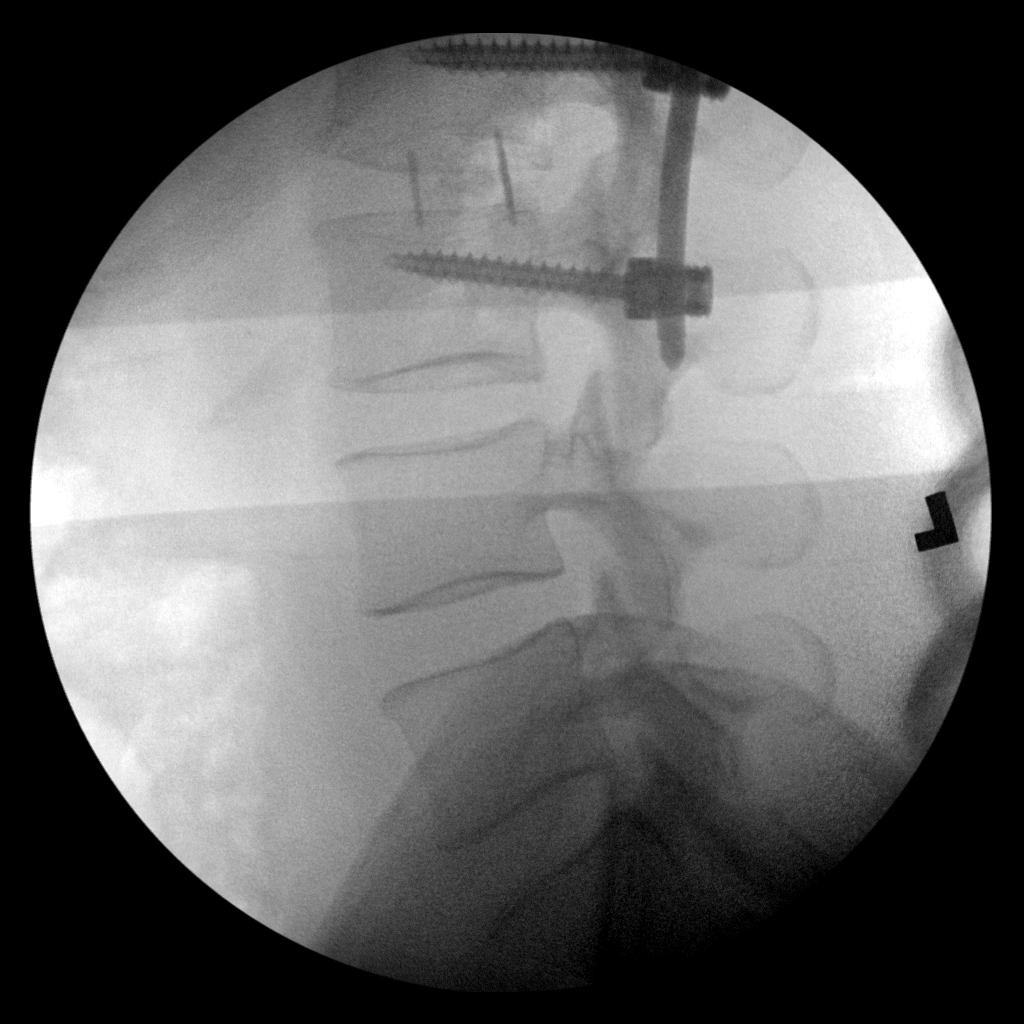

[3 of 3 positions shown; findings below may reference images not displayed]

FINDINGS: Three images obtained in the operating room show unilateral pedicle
screws and posterior rod on the left side of the upper lumbar spine.
No complications identified.
IMPRESSION: 1. Status post XLIF of the upper lumbar spine.

## 2018-05-14 IMAGING — CT CT L SPINE W/O CM
1 of 6 series · 6 of 14 positions shown, 8 images · non-contrast
Comparison: MRI 11/21/2015

CLINICAL DATA: Intervertebral disc degeneration, lumbar. Left leg
pain and numbness.

EXAM:
CT LUMBAR SPINE WITHOUT CONTRAST
TECHNIQUE: Multidetector CT imaging of the lumbar spine was performed without
intravenous contrast administration. Multiplanar CT image
reconstructions were also generated.

[Series 2: l spine soft · axial · 0.36mm/px · z∈[+699,+879]mm · 6 of 85 slices shown, 8 images]
[im 13/85  soft-tissue]
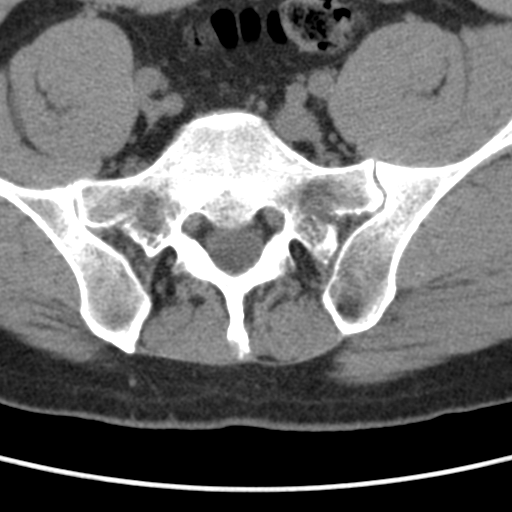
[im 13/85  bone]
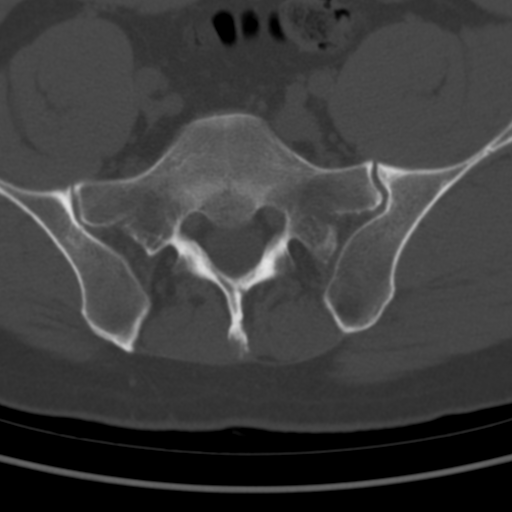
[im 25/85  bone]
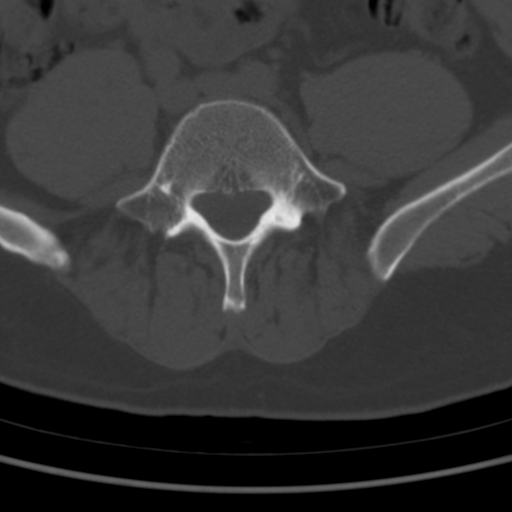
[im 37/85  bone]
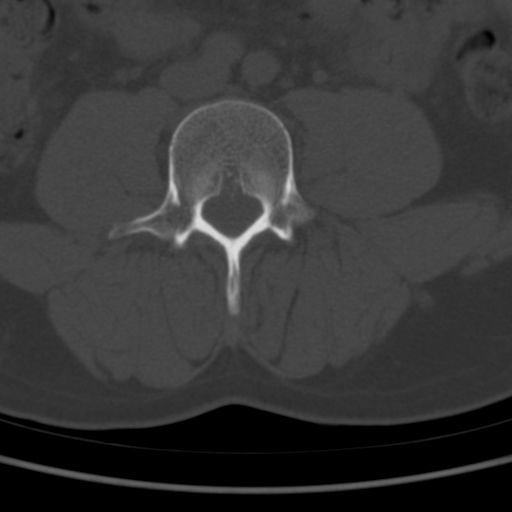
[im 49/85  bone]
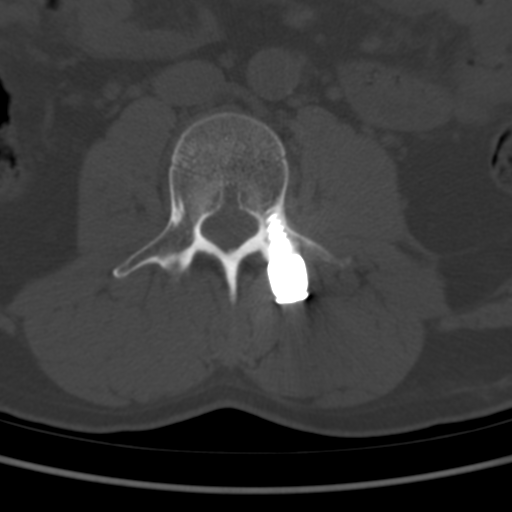
[im 61/85  soft-tissue]
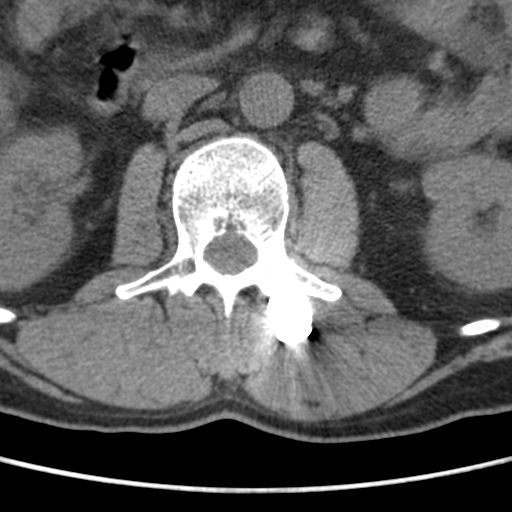
[im 61/85  bone]
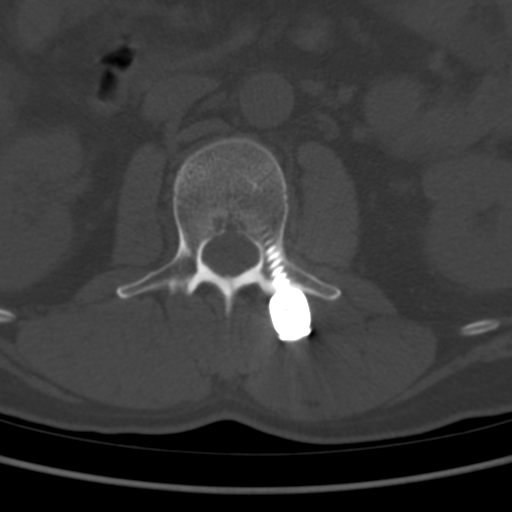
[im 73/85  bone]
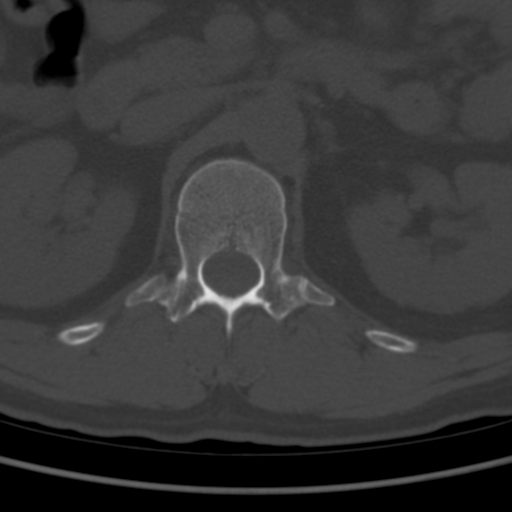

[6 of 14 positions shown; findings below may reference images not displayed]

FINDINGS: Segmentation: 5 lumbar type vertebrae.

Alignment: Normal.

Vertebrae: L2-3 discectomy and left posterolateral fusion.
Intervertebral cage bone graft is incorporated into the L2 inferior
endplate, but not visibly incorporated into the L3 vertebra. No
posterior-lateral bony fusion is noted. No signs of hardware failure
or fracture.

Paraspinal and other soft tissues: Punctate right renal calculus.

Canal: There is dorsal dural calcification throughout much of the
lumbar spine, nonspecific and present since at least 6878 without
evidence of progression. Calcification has been seen to some degree
since 4551 CT. There is no nerve root clumping or enhancement by MRI
to suggest arachnoiditis.

Disc levels: No evidence of herniation or impingement. No
degenerative facet spurring.
IMPRESSION: 1. No evidence of impingement.  No new finding compared to 2839 MRI.
2. L2-3 discectomy and left posterolateral fusion. Solid bony fusion
is not demonstrated but no signs of hardware failure or loosening.
3. Chronic lumbar dorsal dural calcification without evidence of
progression since 6878. No signs of arachnoiditis by interval MRI.
4. Tiny right renal calculus.

## 2020-08-25 ENCOUNTER — Inpatient Hospital Stay
Admit: 2020-08-25 | Discharge: 2020-08-25 | Disposition: A | Discharge status: Home / Self Care | Payer: PRIVATE HEALTH INSURANCE | Attending: Emergency Medicine

## 2020-08-25 ENCOUNTER — Emergency Department: Admit: 2020-08-25 | Payer: PRIVATE HEALTH INSURANCE

## 2020-08-25 DIAGNOSIS — R109 Unspecified abdominal pain: Secondary | ICD-10-CM

## 2020-08-25 LAB — URINALYSIS WITH MICROSCOPIC
Bacteria, UA: NONE SEEN /HPF
Bilirubin Urine: NEGATIVE
Blood, Urine: NEGATIVE
Glucose, Ur: NEGATIVE mg/dL
Ketones, Urine: NEGATIVE mg/dL
Leukocyte Esterase, Urine: NEGATIVE
Nitrite, Urine: NEGATIVE
Protein, UA: NEGATIVE mg/dL
Specific Gravity, UA: 1.02 (ref 1.005–1.030)
Urobilinogen, Urine: 0.2 E.U./dL (ref <2.0)
pH, UA: 7 (ref 5.0–9.0)

## 2020-08-25 LAB — COMPREHENSIVE METABOLIC PANEL W/ REFLEX TO MG FOR LOW K
ALT: 23 U/L (ref 0–40)
AST: 23 U/L (ref 0–39)
Albumin: 4.5 g/dL (ref 3.5–5.2)
Alkaline Phosphatase: 97 U/L (ref 40–129)
Anion Gap: 12 mmol/L (ref 7–16)
BUN: 26 mg/dL — ABNORMAL HIGH (ref 6–20)
CO2: 26 mmol/L (ref 22–29)
Calcium: 9 mg/dL (ref 8.6–10.2)
Chloride: 103 mmol/L (ref 98–107)
Creatinine: 1 mg/dL (ref 0.7–1.2)
GFR African American: >60
GFR Non-African American: >60 mL/min/{1.73_m2} (ref >=60)
Glucose: 101 mg/dL — ABNORMAL HIGH (ref 74–99)
Potassium reflex Magnesium: 4.2 mmol/L (ref 3.5–5.0)
Sodium: 141 mmol/L (ref 132–146)
Total Bilirubin: <0.2 mg/dL (ref 0.0–1.2)
Total Protein: 6.5 g/dL (ref 6.4–8.3)

## 2020-08-25 LAB — CBC WITH AUTO DIFFERENTIAL
Basophils %: 0.7 % (ref 0.0–2.0)
Basophils Absolute: 0.04 E9/L (ref 0.00–0.20)
Eosinophils %: 0.9 % (ref 0.0–6.0)
Eosinophils Absolute: 0.05 E9/L (ref 0.05–0.50)
Hematocrit: 42.1 % (ref 37.0–54.0)
Hemoglobin: 13.5 g/dL (ref 12.5–16.5)
Immature Granulocytes #: 0.01 E9/L
Immature Granulocytes %: 0.2 % (ref 0.0–5.0)
Lymphocytes %: 25.5 % (ref 20.0–42.0)
Lymphocytes Absolute: 1.44 E9/L — ABNORMAL LOW (ref 1.50–4.00)
MCH: 28.6 pg (ref 26.0–35.0)
MCHC: 32.1 % (ref 32.0–34.5)
MCV: 89.2 fL (ref 80.0–99.9)
MPV: 8.7 fL (ref 7.0–12.0)
Monocytes %: 5 % (ref 2.0–12.0)
Monocytes Absolute: 0.28 E9/L (ref 0.10–0.95)
Neutrophils %: 67.7 % (ref 43.0–80.0)
Neutrophils Absolute: 3.83 E9/L (ref 1.80–7.30)
Platelets: 239 E9/L (ref 130–450)
RBC: 4.72 E12/L (ref 3.80–5.80)
RDW: 13.2 fL (ref 11.5–15.0)
WBC: 5.7 E9/L (ref 4.5–11.5)

## 2020-08-25 LAB — LACTIC ACID: Lactic Acid: 1.2 mmol/L (ref 0.5–2.2)

## 2020-08-25 LAB — TROPONIN: Troponin, High Sensitivity: <6 ng/L (ref 0–11)

## 2020-08-25 MED ORDER — ONDANSETRON 4 MG PO TBDP
4 MG | ORAL_TABLET | Freq: Three times a day (TID) | ORAL | 0 refills | Status: AC | PRN
Start: 2020-08-25 — End: 2020-09-01

## 2020-08-25 MED ORDER — KETOROLAC TROMETHAMINE 30 MG/ML IJ SOLN
30 MG/ML | Freq: Once | INTRAMUSCULAR | Status: AC
Start: 2020-08-25 — End: 2020-08-25
  Administered 2020-08-25: 06:00:00 15 mg via INTRAVENOUS

## 2020-08-25 MED ORDER — KETOROLAC TROMETHAMINE 10 MG PO TABS
10 MG | ORAL_TABLET | Freq: Four times a day (QID) | ORAL | 0 refills | Status: AC | PRN
Start: 2020-08-25 — End: 2020-08-30

## 2020-08-25 MED ORDER — ORPHENADRINE CITRATE 30 MG/ML IJ SOLN
30 MG/ML | Freq: Once | INTRAMUSCULAR | Status: DC
Start: 2020-08-25 — End: 2020-08-25

## 2020-08-25 MED ORDER — ONDANSETRON HCL 4 MG/2ML IJ SOLN
4 MG/2ML | Freq: Once | INTRAMUSCULAR | Status: AC
Start: 2020-08-25 — End: 2020-08-25
  Administered 2020-08-25: 06:00:00 4 mg via INTRAVENOUS

## 2020-08-25 MED ORDER — SODIUM CHLORIDE 0.9 % IV BOLUS
0.9 % | Freq: Once | INTRAVENOUS | Status: AC
Start: 2020-08-25 — End: 2020-08-25
  Administered 2020-08-25: 06:00:00 1000 mL via INTRAVENOUS

## 2020-08-25 MED FILL — ONDANSETRON HCL 4 MG/2ML IJ SOLN: 4 MG/2ML | INTRAMUSCULAR | Qty: 2

## 2020-08-25 MED FILL — KETOROLAC TROMETHAMINE 30 MG/ML IJ SOLN: 30 mg/mL | INTRAMUSCULAR | Qty: 1

## 2020-08-25 NOTE — ED Provider Notes (Signed)
55 y.o. year old male presenting to the emergency room with concerns of right flank pain beginning prior to arrival.  Patient reports that symptom's onset prior to arrival 30 minutes ago. Worsen with nothing.  Improves with nothing. Severity of 7 out of 10 pain, with radiation along right flank. Symptoms are intermittent in timing. Symptoms described as sharp.  Patient reports associated symptoms of nothing. Patient in no acute distress.  Patient reports that he was awoken from sleep with pain.    Chief Complaint   Patient presents with   ??? Flank Pain     bilateral, started ago, has hx of kidney stones, states "more on R side"       Review of Systems   Constitutional: Negative for chills, fatigue and fever.   HENT: Negative for congestion, ear pain, rhinorrhea, sore throat, trouble swallowing and voice change.    Eyes: Negative for photophobia and visual disturbance.   Respiratory: Negative for cough, shortness of breath and wheezing.    Cardiovascular: Negative for chest pain and leg swelling.   Gastrointestinal: Positive for abdominal pain and nausea. Negative for blood in stool, diarrhea and vomiting.   Genitourinary: Negative for dysuria, frequency, hematuria, scrotal swelling, testicular pain and urgency.   Musculoskeletal: Negative for arthralgias, neck pain and neck stiffness.   Skin: Negative for rash and wound.   Neurological: Negative for dizziness, syncope, weakness, numbness and headaches.   Psychiatric/Behavioral: Negative for behavioral problems and confusion. The patient is nervous/anxious.         Physical Exam  Vitals reviewed.   Constitutional:       General: He is not in acute distress.     Appearance: Normal appearance.   HENT:      Head: Normocephalic.      Right Ear: External ear normal.      Left Ear: External ear normal.      Nose: Nose normal.      Mouth/Throat:      Mouth: Mucous membranes are moist.   Eyes:      General:         Right eye: No discharge.         Left eye: No  discharge.      Conjunctiva/sclera: Conjunctivae normal.   Cardiovascular:      Rate and Rhythm: Normal rate and regular rhythm.      Heart sounds: No friction rub. No gallop.    Pulmonary:      Effort: Pulmonary effort is normal. No respiratory distress.      Breath sounds: No stridor. No wheezing, rhonchi or rales.   Abdominal:      General: Abdomen is flat. There is no distension.      Palpations: Abdomen is soft.      Tenderness: There is no abdominal tenderness. There is no right CVA tenderness, left CVA tenderness, guarding or rebound. Negative signs include Murphy's sign.   Musculoskeletal:         General: Tenderness present. No deformity. Normal range of motion.      Cervical back: Normal range of motion and neck supple. No rigidity or tenderness.      Right lower leg: No edema.      Left lower leg: No edema.   Skin:     General: Skin is warm.      Coloration: Skin is not jaundiced.      Findings: No bruising or erythema.   Neurological:      Mental Status: He  is alert and oriented to person, place, and time.      Sensory: No sensory deficit.      Motor: No weakness.   Psychiatric:         Mood and Affect: Mood is anxious.         Behavior: Behavior normal.          Procedures     EKG:  This EKG is signed by emergency department physician.    Rate: 55  Rhythm: Sinus  AXIS:normal  ST Changes: No ST elevations noted  Interpretation: sinus bradycardia  Comparison: no previous EKG available     MDM  Number of Diagnoses or Management Options  Flank pain  Diagnosis management comments: 55 year old male presenting to emergency department complaints of flank pain right-sided.  Pain started prior to arrival, woke from sleep.  History of kidney stones.  States that this is not as painful as kidney stones he has had in the past.  Troponins negative, lactic negative, UA negative.  Toradol, fluids given, Zofran given.  CT demonstrated no acute findings.  X-ray with no acute process patient on reevaluation reported  improvement in symptomatology, sitting comfortably in bed able to answer questions without difficulty.  Patient reports that he has been riding his bicycle every day with 100 mile ride on Sunday, and has recently started a new breathing machine which he wears over his diaphragm which he believes may be a possible source of his symptomatology.  Through shared decision-making with patient, will plan to discharge at this time with follow-up with PCP.  Return instructions have been given, patient accompanied by his sister who will take him home.       ED Course as of 08/25/20 0500   Sun Aug 25, 2020   0255 Eosinophils Absolute: 0.05 [JC]   0320 Reevaluated the patient.  He is resting comfortably.  He states he is pain-free at this time.  He does not have any symptoms at all and would like to go home.    He states the fluids and Toradol significantly helped with his symptoms. [KG]      ED Course User Index  [JC] Ma RingsJohn M Chassity Ludke, DO  [KG] Lieutenant DiegoKalie Gargano-Murphy, DO        ED Course as of 08/25/20 0500   Sun Aug 25, 2020   0255 Eosinophils Absolute: 0.05 [JC]   0320 Reevaluated the patient.  He is resting comfortably.  He states he is pain-free at this time.  He does not have any symptoms at all and would like to go home.    He states the fluids and Toradol significantly helped with his symptoms. [KG]      ED Course User Index  [JC] Ma RingsJohn M Meklit Cotta, DO  [KG] Lieutenant DiegoKalie Gargano-Murphy, DO       --------------------------------------------- PAST HISTORY ---------------------------------------------  Past Medical History:  has a past medical history of Kidney stone.    Past Surgical History:  has a past surgical history that includes back surgery.    Social History:  reports that he has quit smoking. He has never used smokeless tobacco. He reports previous alcohol use.    Family History: family history is not on file.     The patient???s home medications have been reviewed.    Allergies:  Aspirin    -------------------------------------------------- RESULTS -------------------------------------------------  Labs:  Results for orders placed or performed during the hospital encounter of 08/25/20   CBC with Auto Differential   Result Value Ref Range  WBC 5.7 4.5 - 11.5 E9/L    RBC 4.72 3.80 - 5.80 E12/L    Hemoglobin 13.5 12.5 - 16.5 g/dL    Hematocrit 33.8 32.9 - 54.0 %    MCV 89.2 80.0 - 99.9 fL    MCH 28.6 26.0 - 35.0 pg    MCHC 32.1 32.0 - 34.5 %    RDW 13.2 11.5 - 15.0 fL    Platelets 239 130 - 450 E9/L    MPV 8.7 7.0 - 12.0 fL    Neutrophils % 67.7 43.0 - 80.0 %    Immature Granulocytes % 0.2 0.0 - 5.0 %    Lymphocytes % 25.5 20.0 - 42.0 %    Monocytes % 5.0 2.0 - 12.0 %    Eosinophils % 0.9 0.0 - 6.0 %    Basophils % 0.7 0.0 - 2.0 %    Neutrophils Absolute 3.83 1.80 - 7.30 E9/L    Immature Granulocytes # 0.01 E9/L    Lymphocytes Absolute 1.44 (L) 1.50 - 4.00 E9/L    Monocytes Absolute 0.28 0.10 - 0.95 E9/L    Eosinophils Absolute 0.05 0.05 - 0.50 E9/L    Basophils Absolute 0.04 0.00 - 0.20 E9/L   Comprehensive Metabolic Panel w/ Reflex to MG   Result Value Ref Range    Sodium 141 132 - 146 mmol/L    Potassium reflex Magnesium 4.2 3.5 - 5.0 mmol/L    Chloride 103 98 - 107 mmol/L    CO2 26 22 - 29 mmol/L    Anion Gap 12 7 - 16 mmol/L    Glucose 101 (H) 74 - 99 mg/dL    BUN 26 (H) 6 - 20 mg/dL    CREATININE 1.0 0.7 - 1.2 mg/dL    GFR Non-African American >60 >=60 mL/min/1.73    GFR African American >60     Calcium 9.0 8.6 - 10.2 mg/dL    Total Protein 6.5 6.4 - 8.3 g/dL    Albumin 4.5 3.5 - 5.2 g/dL    Total Bilirubin <1.9 0.0 - 1.2 mg/dL    Alkaline Phosphatase 97 40 - 129 U/L    ALT 23 0 - 40 U/L    AST 23 0 - 39 U/L   Urinalysis with Microscopic   Result Value Ref Range    Color, UA Yellow Straw/Yellow    Clarity, UA Clear Clear    Glucose, Ur Negative Negative mg/dL    Bilirubin Urine Negative Negative    Ketones, Urine Negative Negative mg/dL    Specific Gravity, UA 1.020 1.005 - 1.030     Blood, Urine Negative Negative    pH, UA 7.0 5.0 - 9.0    Protein, UA Negative Negative mg/dL    Urobilinogen, Urine 0.2 <2.0 E.U./dL    Nitrite, Urine Negative Negative    Leukocyte Esterase, Urine Negative Negative    WBC, UA NONE 0 - 5 /HPF    RBC, UA NONE 0 - 2 /HPF    Bacteria, UA NONE SEEN None Seen /HPF   Lactic Acid   Result Value Ref Range    Lactic Acid 1.2 0.5 - 2.2 mmol/L   Troponin   Result Value Ref Range    Troponin, High Sensitivity <6 0 - 11 ng/L   EKG 12 Lead   Result Value Ref Range    Ventricular Rate 55 BPM    Atrial Rate 55 BPM    P-R Interval 154 ms    QRS Duration 92 ms  Q-T Interval 440 ms    QTc Calculation (Bazett) 420 ms    P Axis 15 degrees    R Axis 16 degrees    T Axis 31 degrees       Radiology:  XR CHEST PORTABLE   Final Result   No acute cardiopulmonary process.         CT ABDOMEN PELVIS WO CONTRAST Additional Contrast? None   Final Result   No acute intraabdominal or intrapelvic pathology.             ------------------------- NURSING NOTES AND VITALS REVIEWED ---------------------------  Date / Time Roomed:  08/25/2020  1:07 AM  ED Bed Assignment:  04/04    The nursing notes within the ED encounter and vital signs as below have been reviewed.   BP 130/80    Pulse 68    Temp 97.4 ??F (36.3 ??C) (Temporal)    Resp 16    Ht 5\' 9"  (1.753 m)    Wt 170 lb (77.1 kg)    SpO2 97%    BMI 25.10 kg/m??   Oxygen Saturation Interpretation: Normal      ------------------------------------------ PROGRESS NOTES ------------------------------------------  I have spoken with the patient and discussed today???s results, in addition to providing specific details for the plan of care and counseling regarding the diagnosis and prognosis.  Their questions are answered at this time and they are agreeable with the plan. I discussed at length with them reasons for immediate return here for re evaluation. They will followup with primary care by calling their office tomorrow.      ---------------------------------  ADDITIONAL PROVIDER NOTES ---------------------------------  At this time the patient is without objective evidence of an acute process requiring hospitalization or inpatient management.  They have remained hemodynamically stable throughout their entire ED visit and are stable for discharge with outpatient follow-up.     The plan has been discussed in detail and they are aware of the specific conditions for emergent return, as well as the importance of follow-up.      Discharge Medication List as of 08/25/2020  3:22 AM      START taking these medications    Details   ketorolac (TORADOL) 10 MG tablet Take 1 tablet by mouth every 6 hours as needed for Pain Patient received IV/IM dose of ketorolac in ED without adverse effect, Disp-20 tablet, R-0Print      ondansetron (ZOFRAN ODT) 4 MG disintegrating tablet Take 1 tablet by mouth every 8 hours as needed for Nausea or Vomiting, Disp-21 tablet, R-0Print             Diagnosis:  1. Flank pain        Disposition:  Patient's disposition: Discharge to home  Patient's condition is stable.        Attending was present and available throughout encounter including all critical portions; See Attending Note/Attestation for Final Plan       10/25/2020, DO  Resident  08/25/20 0500

## 2020-08-26 LAB — EKG 12-LEAD
Atrial Rate: 55 {beats}/min
P Axis: 15 degrees
P-R Interval: 154 ms
Q-T Interval: 440 ms
QRS Duration: 92 ms
QTc Calculation (Bazett): 420 ms
R Axis: 16 degrees
T Axis: 31 degrees
Ventricular Rate: 55 {beats}/min
